# Patient Record
Sex: Male | Born: 1950 | Race: Asian | Hispanic: No | Marital: Married | Smoking: Former smoker
Health system: Southern US, Community
[De-identification: ages and names within clinical notes are randomized; demographics above are authoritative.]

## PROBLEM LIST (undated history)

## (undated) DIAGNOSIS — J189 Pneumonia, unspecified organism: Secondary | ICD-10-CM

## (undated) DIAGNOSIS — I4891 Unspecified atrial fibrillation: Secondary | ICD-10-CM

## (undated) DIAGNOSIS — I509 Heart failure, unspecified: Secondary | ICD-10-CM

## (undated) DIAGNOSIS — R011 Cardiac murmur, unspecified: Secondary | ICD-10-CM

## (undated) DIAGNOSIS — J449 Chronic obstructive pulmonary disease, unspecified: Secondary | ICD-10-CM

## (undated) HISTORY — PX: CHOLECYSTECTOMY: SHX55

## (undated) HISTORY — PX: CORONARY ARTERY BYPASS GRAFT: SHX141

## (undated) HISTORY — PX: OTHER SURGICAL HISTORY: SHX169

---

## 2013-07-27 ENCOUNTER — Emergency Department (HOSPITAL_BASED_OUTPATIENT_CLINIC_OR_DEPARTMENT_OTHER): Payer: Medicare Other

## 2013-07-27 ENCOUNTER — Emergency Department (HOSPITAL_BASED_OUTPATIENT_CLINIC_OR_DEPARTMENT_OTHER)
Admission: EM | Admit: 2013-07-27 | Discharge: 2013-07-27 | Disposition: A | Discharge status: Home / Self Care | Payer: Medicare Other | Attending: Emergency Medicine | Admitting: Emergency Medicine

## 2013-07-27 ENCOUNTER — Encounter (HOSPITAL_BASED_OUTPATIENT_CLINIC_OR_DEPARTMENT_OTHER): Payer: Self-pay | Admitting: Emergency Medicine

## 2013-07-27 DIAGNOSIS — Z8639 Personal history of other endocrine, nutritional and metabolic disease: Secondary | ICD-10-CM | POA: Insufficient documentation

## 2013-07-27 DIAGNOSIS — I509 Heart failure, unspecified: Secondary | ICD-10-CM | POA: Insufficient documentation

## 2013-07-27 DIAGNOSIS — Z87891 Personal history of nicotine dependence: Secondary | ICD-10-CM | POA: Insufficient documentation

## 2013-07-27 DIAGNOSIS — I4891 Unspecified atrial fibrillation: Secondary | ICD-10-CM | POA: Insufficient documentation

## 2013-07-27 DIAGNOSIS — Z79899 Other long term (current) drug therapy: Secondary | ICD-10-CM | POA: Insufficient documentation

## 2013-07-27 DIAGNOSIS — J40 Bronchitis, not specified as acute or chronic: Secondary | ICD-10-CM | POA: Insufficient documentation

## 2013-07-27 DIAGNOSIS — R5381 Other malaise: Secondary | ICD-10-CM | POA: Insufficient documentation

## 2013-07-27 DIAGNOSIS — Z8701 Personal history of pneumonia (recurrent): Secondary | ICD-10-CM | POA: Insufficient documentation

## 2013-07-27 DIAGNOSIS — Z7901 Long term (current) use of anticoagulants: Secondary | ICD-10-CM | POA: Insufficient documentation

## 2013-07-27 DIAGNOSIS — Z951 Presence of aortocoronary bypass graft: Secondary | ICD-10-CM | POA: Insufficient documentation

## 2013-07-27 DIAGNOSIS — R011 Cardiac murmur, unspecified: Secondary | ICD-10-CM | POA: Insufficient documentation

## 2013-07-27 DIAGNOSIS — R5383 Other fatigue: Secondary | ICD-10-CM

## 2013-07-27 DIAGNOSIS — Z862 Personal history of diseases of the blood and blood-forming organs and certain disorders involving the immune mechanism: Secondary | ICD-10-CM | POA: Insufficient documentation

## 2013-07-27 HISTORY — DX: Hemochromatosis, unspecified: E83.119

## 2013-07-27 HISTORY — DX: Unspecified atrial fibrillation: I48.91

## 2013-07-27 HISTORY — DX: Pneumonia, unspecified organism: J18.9

## 2013-07-27 HISTORY — DX: Heart failure, unspecified: I50.9

## 2013-07-27 HISTORY — DX: Cardiac murmur, unspecified: R01.1

## 2013-07-27 LAB — CBC WITH DIFFERENTIAL/PLATELET
BASOS ABS: 0 10*3/uL (ref 0.0–0.1)
Basophils Relative: 0 % (ref 0–1)
EOS PCT: 27 % — AB (ref 0–5)
Eosinophils Absolute: 2.1 10*3/uL — ABNORMAL HIGH (ref 0.0–0.7)
HCT: 28.4 % — ABNORMAL LOW (ref 39.0–52.0)
Hemoglobin: 9 g/dL — ABNORMAL LOW (ref 13.0–17.0)
Lymphocytes Relative: 28 % (ref 12–46)
Lymphs Abs: 2.2 10*3/uL (ref 0.7–4.0)
MCH: 24.5 pg — ABNORMAL LOW (ref 26.0–34.0)
MCHC: 31.7 g/dL (ref 30.0–36.0)
MCV: 77.2 fL — AB (ref 78.0–100.0)
MONOS PCT: 8 % (ref 3–12)
Monocytes Absolute: 0.6 10*3/uL (ref 0.1–1.0)
NEUTROS PCT: 37 % — AB (ref 43–77)
Neutro Abs: 2.8 10*3/uL (ref 1.7–7.7)
PLATELETS: 477 10*3/uL — AB (ref 150–400)
RBC: 3.68 MIL/uL — AB (ref 4.22–5.81)
RDW: 27.1 % — ABNORMAL HIGH (ref 11.5–15.5)
WBC: 7.7 10*3/uL (ref 4.0–10.5)
nRBC: 174 /100 WBC — ABNORMAL HIGH

## 2013-07-27 LAB — BASIC METABOLIC PANEL
BUN: 15 mg/dL (ref 6–23)
CO2: 24 meq/L (ref 19–32)
Calcium: 9.2 mg/dL (ref 8.4–10.5)
Chloride: 100 mEq/L (ref 96–112)
Creatinine, Ser: 1.6 mg/dL — ABNORMAL HIGH (ref 0.50–1.35)
GFR calc Af Amer: 51 mL/min — ABNORMAL LOW (ref >90)
GFR, EST NON AFRICAN AMERICAN: 44 mL/min — AB (ref >90)
Glucose, Bld: 155 mg/dL — ABNORMAL HIGH (ref 70–99)
Potassium: 4 mEq/L (ref 3.7–5.3)
SODIUM: 138 meq/L (ref 137–147)

## 2013-07-27 LAB — D-DIMER, QUANTITATIVE (NOT AT ARMC): D DIMER QUANT: 0.49 ug{FEU}/mL — AB (ref 0.00–0.48)

## 2013-07-27 MED ORDER — HYDROCODONE-HOMATROPINE 5-1.5 MG/5ML PO SYRP: 5.0000 mL | ORAL_SOLUTION | Freq: Four times a day (QID) | ORAL | Status: AC | PRN

## 2013-07-27 MED ORDER — PREDNISONE 20 MG PO TABS: ORAL_TABLET | ORAL | Status: DC

## 2013-07-27 MED ORDER — PREDNISONE 50 MG PO TABS
60.0000 mg | ORAL_TABLET | Freq: Once | ORAL | Status: AC
  Administered 2013-07-27: 60 mg via ORAL
  Filled 2013-07-27 (×2): qty 1

## 2013-07-27 MED ORDER — IPRATROPIUM-ALBUTEROL 0.5-2.5 (3) MG/3ML IN SOLN
3.0000 mL | Freq: Once | RESPIRATORY_TRACT | Status: AC
  Administered 2013-07-27: 3 mL via RESPIRATORY_TRACT
  Filled 2013-07-27: qty 3

## 2013-07-27 NOTE — Discharge Instructions (Signed)

## 2013-07-27 NOTE — ED Notes (Signed)
Recent treatment for pneumonia with PO antibiotics.  Here visiting and reports increased cough and left rib pain.

## 2013-07-27 NOTE — ED Provider Notes (Addendum)
CSN: [id number]     Arrival date & time 07/27/13  1543 History   First MD Initiated Contact with Patient 07/27/13 1600     Chief Complaint  Patient presents with  . Chest Pain     (Consider location/radiation/quality/duration/timing/severity/associated sxs/prior Treatment) HPI Comments: Patient presents with cough and shortness of breath. He's visiting from [place]. He was diagnosed in [place] with pneumonia about a month ago. He finished 2 rounds of antibiotics. He's had worsening cough and shortness of breath with associated wheezing over the last few days. [facility] He was given a prescription for cough syrup but he wasn't able to get it filled because the pharmacy didn't have it. He's had some worsening pain in his left ribs. He sees is worse with coughing. He denies any fevers or chills. He denies any leg pain or swelling. He states he's feeling achy over. He has worsening shortness of breath when he lays down.  Patient is a 63 y.o. male presenting with chest pain.  Chest Pain Associated symptoms: cough, fatigue and shortness of breath   Associated symptoms: no abdominal pain, no back pain, no diaphoresis, no dizziness, no fever, no headache, no nausea, no numbness, not vomiting and no weakness     Past Medical History  Diagnosis Date  . Atrial fibrillation   . Pneumonia   . CHF (congestive heart failure)   . Thalassemia minor   . Murmur   . Hemochromatosis    Past Surgical History  Procedure Laterality Date  . Coronary artery bypass graft    . Cholecystectomy    . Spleenectomy     No family history on file. History  Substance Use Topics  . Smoking status: Former Games developer  . Smokeless tobacco: Not on file  . Alcohol Use: No    Review of Systems  Constitutional: Positive for fatigue. Negative for fever, chills and diaphoresis.  HENT: Negative for congestion, rhinorrhea and sneezing.   Eyes: Negative.   Respiratory: Positive for cough and  shortness of breath. Negative for chest tightness.   Cardiovascular: Positive for chest pain (Left rib pain). Negative for leg swelling.  Gastrointestinal: Negative for nausea, vomiting, abdominal pain, diarrhea and blood in stool.  Genitourinary: Negative for frequency, hematuria, flank pain and difficulty urinating.  Musculoskeletal: Negative for arthralgias and back pain.  Skin: Negative for rash.  Neurological: Negative for dizziness, speech difficulty, weakness, numbness and headaches.      Allergies  Review of patient's allergies indicates no known allergies.  Home Medications   Current Outpatient Rx  Name  Route  Sig  Dispense  Refill  . citalopram (CELEXA) 20 MG tablet   Oral   Take 20 mg by mouth daily.         Marland Kitchen deferasirox (EXJADE) 250 MG disintegrating tablet   Oral   Take by mouth daily before breakfast.         . folic acid (FOLVITE) 1 MG tablet   Oral   Take 1 mg by mouth daily.         Marland Kitchen lisinopril (PRINIVIL,ZESTRIL) 10 MG tablet   Oral   Take 10 mg by mouth daily.         . rivaroxaban (XARELTO) 10 MG TABS tablet   Oral   Take 20 mg by mouth daily.         Marland Kitchen HYDROcodone-homatropine (HYCODAN) 5-1.5 MG/5ML syrup   Oral   Take 5 mLs by mouth every 6 (  six) hours as needed for cough.   120 mL   0   . predniSONE (DELTASONE) 20 MG tablet      2 tabs po daily x 4 days   8 tablet   0    BP 109/60  Pulse 86  Temp(Src) 98.7 F (37.1 C) (Oral)  Resp 20  Ht 5\' 6"  (1.676 m)  Wt 140 lb (63.504 kg)  BMI 22.61 kg/m2  SpO2 95% Physical Exam  Constitutional: He is oriented to person, place, and time. He appears well-developed and well-nourished.  HENT:  Head: Normocephalic and atraumatic.  Mouth/Throat: Oropharynx is clear and moist.  Eyes: Pupils are equal, round, and reactive to light.  Neck: Normal range of motion. Neck supple.  Cardiovascular: Normal rate, regular rhythm and normal heart sounds.   Pulmonary/Chest: Effort normal. No  respiratory distress. He has wheezes. He has no rales. He exhibits no tenderness.  Patient has diffuse expiratory wheezing and rhonchi bilaterally. There is no increased work of breathing.  Abdominal: Soft. Bowel sounds are normal. There is no tenderness. There is no rebound and no guarding.  Musculoskeletal: Normal range of motion. He exhibits no edema.  No calf tenderness  Lymphadenopathy:    He has no cervical adenopathy.  Neurological: He is alert and oriented to person, place, and time.  Skin: Skin is warm and dry. No rash noted.  Psychiatric: He has a normal mood and affect.    ED Course  Procedures (including critical care time) Labs Review Labs Reviewed  D-DIMER, QUANTITATIVE - Abnormal; Notable for the following:    D-Dimer, Quant 0.49 (*)    All other components within normal limits  BASIC METABOLIC PANEL - Abnormal; Notable for the following:    Glucose, Bld 155 (*)    Creatinine, Ser 1.60 (*)    GFR calc non Af Amer 44 (*)    GFR calc Af Amer 51 (*)    All other components within normal limits  CBC WITH DIFFERENTIAL - Abnormal; Notable for the following:    RBC 3.68 (*)    Hemoglobin 9.0 (*)    HCT 28.4 (*)    MCV 77.2 (*)    MCH 24.5 (*)    RDW 27.1 (*)    Platelets 477 (*)    Neutrophils Relative % 37 (*)    Eosinophils Relative 27 (*)    nRBC 174 (*)    Eosinophils Absolute 2.1 (*)    All other components within normal limits  PATHOLOGIST SMEAR REVIEW   Imaging Review Dg Chest 2 View  07/27/2013   CLINICAL DATA:  Cough and congestion  EXAM: CHEST - 2 VIEW  COMPARISON:  None.  FINDINGS: Borderline cardiomegaly with prominence of the left ventricle. Postoperative changes from transverse sternotomy. There is tenting of the left hemidiaphragm compatible with scarring. Lungs are otherwise clear. No pneumothorax or pleural effusion. Mild hyperaeration.  IMPRESSION: No active cardiopulmonary disease. Chronic and postoperative changes are noted   Electronically Signed    By: Maryclare BeanArt  Hoss M.D.   On: 07/27/2013 16:21     EKG Interpretation None      MDM   Final diagnoses:  Bronchitis    Patient presents with cough and wheezing and left-sided rib pain. There's no evidence of pneumonia or pneumothorax on chest x-ray. His symptoms improved markedly after a nebulizer treatment. He was also given a dose of prednisone. I did order a d-dimer that was just barely outside the normal range. I did not realize that he  is on Xarelto. He takes because he had a clot in a vessel in his head when his iron levels were really high due to his hemochromatosis.  He denies hx of DVT/PE.  He did not have any actual pleuritic chest pain. It's worse with coughing but is not worse with deep breathing. He doesn't have any tachycardia or hypoxia. He has no unilateral leg swelling or calf tenderness. Given this and his elevation in creatinine I did not feel that he needed a CTA at this point. I feel that his symptoms are more likely due to bronchitis. He was given dose of prednisone in ED on his discharge with the four-day course of prednisone. He was also given a prescription for Hycodan cough syrup. I did advise him that his creatinine and his blood glucose were elevated and he needs to have this followed up by his doctor in [place]. I advised him return here for symptoms worsen.    Rolan Bucco, MD 07/27/13 1610  Rolan Bucco, MD 07/27/13 9604

## 2013-07-28 LAB — PATHOLOGIST SMEAR REVIEW

## 2014-07-26 ENCOUNTER — Encounter (HOSPITAL_BASED_OUTPATIENT_CLINIC_OR_DEPARTMENT_OTHER): Payer: Self-pay | Admitting: *Deleted

## 2014-07-26 ENCOUNTER — Inpatient Hospital Stay (HOSPITAL_BASED_OUTPATIENT_CLINIC_OR_DEPARTMENT_OTHER)
Admission: EM | Admit: 2014-07-26 | Discharge: 2014-07-29 | DRG: 191 | Disposition: A | Discharge status: Home / Self Care | Payer: Medicare Other | Attending: Internal Medicine | Admitting: Internal Medicine

## 2014-07-26 ENCOUNTER — Emergency Department (HOSPITAL_BASED_OUTPATIENT_CLINIC_OR_DEPARTMENT_OTHER): Payer: Medicare Other

## 2014-07-26 DIAGNOSIS — I482 Chronic atrial fibrillation, unspecified: Secondary | ICD-10-CM | POA: Diagnosis present

## 2014-07-26 DIAGNOSIS — F39 Unspecified mood [affective] disorder: Secondary | ICD-10-CM | POA: Diagnosis present

## 2014-07-26 DIAGNOSIS — J441 Chronic obstructive pulmonary disease with (acute) exacerbation: Principal | ICD-10-CM | POA: Diagnosis present

## 2014-07-26 DIAGNOSIS — R0602 Shortness of breath: Secondary | ICD-10-CM | POA: Diagnosis not present

## 2014-07-26 DIAGNOSIS — Z888 Allergy status to other drugs, medicaments and biological substances status: Secondary | ICD-10-CM

## 2014-07-26 DIAGNOSIS — Z9049 Acquired absence of other specified parts of digestive tract: Secondary | ICD-10-CM | POA: Diagnosis present

## 2014-07-26 DIAGNOSIS — Z87891 Personal history of nicotine dependence: Secondary | ICD-10-CM

## 2014-07-26 DIAGNOSIS — D649 Anemia, unspecified: Secondary | ICD-10-CM | POA: Diagnosis present

## 2014-07-26 DIAGNOSIS — T380X5A Adverse effect of glucocorticoids and synthetic analogues, initial encounter: Secondary | ICD-10-CM | POA: Diagnosis present

## 2014-07-26 DIAGNOSIS — I5032 Chronic diastolic (congestive) heart failure: Secondary | ICD-10-CM | POA: Diagnosis present

## 2014-07-26 DIAGNOSIS — D563 Thalassemia minor: Secondary | ICD-10-CM | POA: Diagnosis not present

## 2014-07-26 DIAGNOSIS — I10 Essential (primary) hypertension: Secondary | ICD-10-CM | POA: Diagnosis present

## 2014-07-26 DIAGNOSIS — R0781 Pleurodynia: Secondary | ICD-10-CM | POA: Diagnosis present

## 2014-07-26 DIAGNOSIS — I509 Heart failure, unspecified: Secondary | ICD-10-CM | POA: Diagnosis not present

## 2014-07-26 DIAGNOSIS — Z7901 Long term (current) use of anticoagulants: Secondary | ICD-10-CM

## 2014-07-26 DIAGNOSIS — R739 Hyperglycemia, unspecified: Secondary | ICD-10-CM | POA: Diagnosis present

## 2014-07-26 DIAGNOSIS — Z951 Presence of aortocoronary bypass graft: Secondary | ICD-10-CM

## 2014-07-26 HISTORY — DX: Chronic obstructive pulmonary disease, unspecified: J44.9

## 2014-07-26 LAB — CBC WITH DIFFERENTIAL/PLATELET
BASOS ABS: 0 10*3/uL (ref 0.0–0.1)
BASOS PCT: 0 % (ref 0–1)
EOS ABS: 0.3 10*3/uL (ref 0.0–0.7)
Eosinophils Relative: 7 % — ABNORMAL HIGH (ref 0–5)
HEMATOCRIT: 26.7 % — AB (ref 39.0–52.0)
Hemoglobin: 8.4 g/dL — ABNORMAL LOW (ref 13.0–17.0)
LYMPHS ABS: 1.7 10*3/uL (ref 0.7–4.0)
Lymphocytes Relative: 36 % (ref 12–46)
MCH: 24.5 pg — AB (ref 26.0–34.0)
MCHC: 31.5 g/dL (ref 30.0–36.0)
MCV: 77.8 fL — ABNORMAL LOW (ref 78.0–100.0)
MONO ABS: 0.1 10*3/uL (ref 0.1–1.0)
Monocytes Relative: 2 % — ABNORMAL LOW (ref 3–12)
NEUTROS ABS: 2.5 10*3/uL (ref 1.7–7.7)
NEUTROS PCT: 55 % (ref 43–77)
PLATELETS: 422 10*3/uL — AB (ref 150–400)
RBC: 3.43 MIL/uL — ABNORMAL LOW (ref 4.22–5.81)
RDW: 27.6 % — ABNORMAL HIGH (ref 11.5–15.5)
WBC: 4.6 10*3/uL (ref 4.0–10.5)
nRBC: 330 /100 WBC — ABNORMAL HIGH

## 2014-07-26 LAB — BRAIN NATRIURETIC PEPTIDE: B Natriuretic Peptide: 272.6 pg/mL — ABNORMAL HIGH (ref 0.0–100.0)

## 2014-07-26 LAB — COMPREHENSIVE METABOLIC PANEL
ALT: 15 U/L (ref 0–53)
AST: 31 U/L (ref 0–37)
Albumin: 3.8 g/dL (ref 3.5–5.2)
Alkaline Phosphatase: 71 U/L (ref 39–117)
Anion gap: 8 (ref 5–15)
BUN: 12 mg/dL (ref 6–23)
CALCIUM: 8.1 mg/dL — AB (ref 8.4–10.5)
CO2: 24 mmol/L (ref 19–32)
CREATININE: 0.93 mg/dL (ref 0.50–1.35)
Chloride: 99 mmol/L (ref 96–112)
GFR, EST NON AFRICAN AMERICAN: 86 mL/min — AB (ref >90)
GLUCOSE: 258 mg/dL — AB (ref 70–99)
Potassium: 4.3 mmol/L (ref 3.5–5.1)
SODIUM: 131 mmol/L — AB (ref 135–145)
Total Bilirubin: 3.7 mg/dL — ABNORMAL HIGH (ref 0.3–1.2)
Total Protein: 7 g/dL (ref 6.0–8.3)

## 2014-07-26 LAB — TROPONIN I

## 2014-07-26 MED ORDER — IPRATROPIUM-ALBUTEROL 0.5-2.5 (3) MG/3ML IN SOLN
3.0000 mL | Freq: Once | RESPIRATORY_TRACT | Status: AC
  Administered 2014-07-26: 3 mL via RESPIRATORY_TRACT
  Filled 2014-07-26: qty 3

## 2014-07-26 MED ORDER — METHYLPREDNISOLONE SODIUM SUCC 125 MG IJ SOLR
125.0000 mg | Freq: Once | INTRAMUSCULAR | Status: AC
  Administered 2014-07-26: 125 mg via INTRAVENOUS
  Filled 2014-07-26: qty 2

## 2014-07-26 MED ORDER — ALBUTEROL (5 MG/ML) CONTINUOUS INHALATION SOLN
10.0000 mg/h | INHALATION_SOLUTION | RESPIRATORY_TRACT | Status: AC
  Administered 2014-07-26: 10 mg/h via RESPIRATORY_TRACT
  Filled 2014-07-26: qty 20

## 2014-07-26 MED ORDER — ALBUTEROL SULFATE (2.5 MG/3ML) 0.083% IN NEBU
2.5000 mg | INHALATION_SOLUTION | Freq: Once | RESPIRATORY_TRACT | Status: AC
  Administered 2014-07-26: 2.5 mg via RESPIRATORY_TRACT
  Filled 2014-07-26: qty 3

## 2014-07-26 NOTE — ED Notes (Signed)
Pt with increased SOB x 2 days- hx of COPD- audible wheezing in triage- RT evaluating

## 2014-07-26 NOTE — ED Notes (Signed)
Respirations even and unlabored while being registered, does not appear in any acute distress.

## 2014-07-26 NOTE — ED Provider Notes (Signed)
CSN: [id number]     Arrival date & time 07/26/14  1925 History  This chart was scribed for Geoffery Lyons, MD by Gwenyth Ober, ED Scribe. This patient was seen in room MH05/MH05 and the patient's care was started at 8:27 PM.    Chief Complaint  Patient presents with  . Shortness of Breath   Patient is a 64 y.o. male presenting with shortness of breath. The history is provided by a relative. No language interpreter was used.  Shortness of Breath Severity:  Moderate Onset quality:  Gradual Duration:  3 days Timing:  Constant Progression:  Worsening Chronicity:  Chronic Relieved by:  Nothing Worsened by:  Nothing tried Ineffective treatments:  None tried Associated symptoms: cough and wheezing   Associated symptoms: no fever     HPI Comments: Barry Reed is a 64 y.o. male with a history of A-fib, CHF, CABG and COPD who presents to the Emergency Department complaining of an acute-on-chronic episode of wheezing and SOB that started yesterday. His daughter states productive cough with black-brown phlegm as associated symptoms. Pt was recently admitted to the hospital for 4 days in [place] where he lives, for pneumonia and was prescribed Prednisone. Pt's daughter denies a history of cardiac catheterization. She denies tobacco use. She also denies fever as an associated symptom.  Past Medical History  Diagnosis Date  . Atrial fibrillation   . Pneumonia   . CHF (congestive heart failure)   . Thalassemia minor   . Murmur   . Hemochromatosis   . COPD (chronic obstructive pulmonary disease)    Past Surgical History  Procedure Laterality Date  . Coronary artery bypass graft    . Cholecystectomy    . Spleenectomy     No family history on file. History  Substance Use Topics  . Smoking status: Former Games developer  . Smokeless tobacco: Never Used  . Alcohol Use: No    Review of Systems  Constitutional: Negative for fever.  Respiratory: Positive for cough, shortness of breath and  wheezing.   All other systems reviewed and are negative.  Allergies  Metoprolol  Home Medications   Prior to Admission medications   Medication Sig Start Date End Date Taking? Authorizing Provider  albuterol (PROVENTIL HFA;VENTOLIN HFA) 108 (90 BASE) MCG/ACT inhaler Inhale into the lungs every 6 (six) hours as needed for wheezing or shortness of breath.   Yes Historical Provider, MD  albuterol-ipratropium (COMBIVENT) 18-103 MCG/ACT inhaler Inhale into the lungs every 4 (four) hours.   Yes Historical Provider, MD  citalopram (CELEXA) 20 MG tablet Take 20 mg by mouth daily.   Yes Historical Provider, MD  folic acid (FOLVITE) 1 MG tablet Take 1 mg by mouth daily.   Yes Historical Provider, MD  furosemide (LASIX) 20 MG tablet Take 20 mg by mouth.   Yes Historical Provider, MD  HYDROcodone-acetaminophen (NORCO) 10-325 MG per tablet Take 1 tablet by mouth every 6 (six) hours as needed.   Yes Historical Provider, MD  loratadine (CLARITIN) 10 MG tablet Take 10 mg by mouth daily.   Yes Historical Provider, MD  rivaroxaban (XARELTO) 10 MG TABS tablet Take 20 mg by mouth daily.   Yes Historical Provider, MD  zolmitriptan (ZOMIG) 5 MG tablet Take 5 mg by mouth as needed for migraine.   Yes Historical Provider, MD  deferasirox (EXJADE) 250 MG disintegrating tablet Take by mouth daily before breakfast.    Historical Provider, MD  HYDROcodone-homatropine (HYCODAN) 5-1.5 MG/5ML syrup Take 5 mLs by mouth every 6 (six)  hours as needed for cough. 07/27/13   Rolan BuccoMelanie Belfi, MD  lisinopril (PRINIVIL,ZESTRIL) 10 MG tablet Take 10 mg by mouth daily.    Historical Provider, MD  predniSONE (DELTASONE) 20 MG tablet 2 tabs po daily x 4 days 07/27/13   Rolan BuccoMelanie Belfi, MD   BP 111/63 mmHg  Pulse 92  Temp(Src) 98.2 F (36.8 C) (Oral)  Resp 24  Ht 5\' 6"  (1.676 m)  Wt 134 lb (60.782 kg)  BMI 21.64 kg/m2  SpO2 97% Physical Exam  Constitutional: He appears well-developed and well-nourished. No distress.  HENT:  Head:  Normocephalic and atraumatic.  Eyes: Conjunctivae and EOM are normal.  Neck: Neck supple. No tracheal deviation present.  Cardiovascular: Normal rate, regular rhythm, normal heart sounds and intact distal pulses.   Pulmonary/Chest: He is in respiratory distress. He has wheezes.  Bilateral expiratory wheezes present. Mild respiratory distress noted.  Musculoskeletal: Normal range of motion. He exhibits no edema.  Skin: Skin is warm and dry.  Psychiatric: He has a normal mood and affect. His behavior is normal.  Nursing note and vitals reviewed.   ED Course  Procedures   DIAGNOSTIC STUDIES: Oxygen Saturation is 97% on RA, normal by my interpretation.    COORDINATION OF CARE: 8:37 PM Discussed treatment plan with pt which includes chest x-ray and breathing treatment. Pt agreed to plan.   Labs Review Labs Reviewed - No data to display  Imaging Review No results found.   EKG Interpretation   Date/Time:  Thursday July 26 2014 20:56:56 EDT Ventricular Rate:  115 PR Interval:    QRS Duration: 90 QT Interval:  338 QTC Calculation: 467 R Axis:   122 Text Interpretation:  Atrial fibrillation with rapid ventricular response  Left posterior fascicular block Possible Inferior infarct , age  undetermined Abnormal ECG Confirmed by DELOS  MD, Zerline Melchior ([id number]) on  07/26/2014 9:18:23 PM      MDM   Final diagnoses:  None    Patient is a 64 year old male with history of COPD, CHF, atrial fibrillation. He presents for evaluation of shortness of breath that is worsened over the past 3 days. [place] was recently [place] exacerbation/p[place]onia. Today's workup is consistent with an exacerbation of COPD. There is no evidence for a cardiac etiology. He was given albuterol treatments, steroids, but continues to wheeze and displaying mild respiratory distress. I've discussed the case with the hospitalist who agrees to admit. [facility].  I personally performed the services described in this documentation, which was scribed in my presence. The recorded information has been reviewed and is accurate.      Geoffery Lyonsouglas Zaylin Pistilli, MD 07/28/14 2312

## 2014-07-27 DIAGNOSIS — J441 Chronic obstructive pulmonary disease with (acute) exacerbation: Principal | ICD-10-CM

## 2014-07-27 DIAGNOSIS — Z7901 Long term (current) use of anticoagulants: Secondary | ICD-10-CM

## 2014-07-27 DIAGNOSIS — D649 Anemia, unspecified: Secondary | ICD-10-CM | POA: Diagnosis present

## 2014-07-27 DIAGNOSIS — I482 Chronic atrial fibrillation, unspecified: Secondary | ICD-10-CM | POA: Diagnosis present

## 2014-07-27 DIAGNOSIS — R739 Hyperglycemia, unspecified: Secondary | ICD-10-CM | POA: Diagnosis present

## 2014-07-27 DIAGNOSIS — R0781 Pleurodynia: Secondary | ICD-10-CM | POA: Diagnosis present

## 2014-07-27 LAB — CBC WITH DIFFERENTIAL/PLATELET
BAND NEUTROPHILS: 0 % (ref 0–10)
Basophils Absolute: 0 10*3/uL (ref 0.0–0.1)
Basophils Relative: 0 % (ref 0–1)
Blasts: 0 %
EOS ABS: 0.3 10*3/uL (ref 0.0–0.7)
EOS PCT: 2 % (ref 0–5)
HEMATOCRIT: 26.1 % — AB (ref 39.0–52.0)
HEMOGLOBIN: 7.9 g/dL — AB (ref 13.0–17.0)
LYMPHS ABS: 0.4 10*3/uL — AB (ref 0.7–4.0)
Lymphocytes Relative: 3 % — ABNORMAL LOW (ref 12–46)
MCH: 23.9 pg — ABNORMAL LOW (ref 26.0–34.0)
MCHC: 30.3 g/dL (ref 30.0–36.0)
MCV: 79.1 fL (ref 78.0–100.0)
MONO ABS: 0.1 10*3/uL (ref 0.1–1.0)
MONOS PCT: 1 % — AB (ref 3–12)
Metamyelocytes Relative: 0 %
Myelocytes: 0 %
NRBC: 0 /100{WBCs}
Neutro Abs: 12.8 10*3/uL — ABNORMAL HIGH (ref 1.7–7.7)
Neutrophils Relative %: 94 % — ABNORMAL HIGH (ref 43–77)
PLATELETS: 394 10*3/uL (ref 150–400)
Promyelocytes Absolute: 0 %
RBC: 3.3 MIL/uL — AB (ref 4.22–5.81)
RDW: 27.1 % — ABNORMAL HIGH (ref 11.5–15.5)
WBC: 13.6 10*3/uL — ABNORMAL HIGH (ref 4.0–10.5)

## 2014-07-27 LAB — COMPREHENSIVE METABOLIC PANEL
ALT: 14 U/L (ref 0–53)
AST: 31 U/L (ref 0–37)
Albumin: 3.3 g/dL — ABNORMAL LOW (ref 3.5–5.2)
Alkaline Phosphatase: 71 U/L (ref 39–117)
Anion gap: 11 (ref 5–15)
BILIRUBIN TOTAL: 4.1 mg/dL — AB (ref 0.3–1.2)
BUN: 14 mg/dL (ref 6–23)
CALCIUM: 8.2 mg/dL — AB (ref 8.4–10.5)
CO2: 22 mmol/L (ref 19–32)
CREATININE: 1.52 mg/dL — AB (ref 0.50–1.35)
Chloride: 98 mmol/L (ref 96–112)
GFR calc Af Amer: 54 mL/min — ABNORMAL LOW (ref >90)
GFR calc non Af Amer: 46 mL/min — ABNORMAL LOW (ref >90)
Glucose, Bld: 459 mg/dL — ABNORMAL HIGH (ref 70–99)
Potassium: 4.6 mmol/L (ref 3.5–5.1)
SODIUM: 131 mmol/L — AB (ref 135–145)
TOTAL PROTEIN: 6.5 g/dL (ref 6.0–8.3)

## 2014-07-27 LAB — GLUCOSE, CAPILLARY: Glucose-Capillary: 397 mg/dL — ABNORMAL HIGH (ref 70–99)

## 2014-07-27 LAB — MAGNESIUM: MAGNESIUM: 1.8 mg/dL (ref 1.5–2.5)

## 2014-07-27 LAB — TROPONIN I
Troponin I: 0.04 ng/mL — ABNORMAL HIGH (ref <0.031)
Troponin I: 0.06 ng/mL — ABNORMAL HIGH (ref <0.031)
Troponin I: 0.05 ng/mL — ABNORMAL HIGH (ref <0.031)

## 2014-07-27 LAB — PROTIME-INR
INR: 3.05 — AB (ref 0.00–1.49)
PROTHROMBIN TIME: 31.8 s — AB (ref 11.6–15.2)

## 2014-07-27 MED ORDER — INSULIN GLARGINE 100 UNIT/ML ~~LOC~~ SOLN
10.0000 [IU] | Freq: Every day | SUBCUTANEOUS | Status: DC
  Administered 2014-07-27: 10 [IU] via SUBCUTANEOUS
  Filled 2014-07-27 (×3): qty 0.1

## 2014-07-27 MED ORDER — LEVOFLOXACIN IN D5W 750 MG/150ML IV SOLN
750.0000 mg | INTRAVENOUS | Status: DC
  Administered 2014-07-27 – 2014-07-28 (×2): 750 mg via INTRAVENOUS
  Filled 2014-07-27 (×2): qty 150

## 2014-07-27 MED ORDER — LEVALBUTEROL HCL 0.63 MG/3ML IN NEBU
0.6300 mg | INHALATION_SOLUTION | Freq: Four times a day (QID) | RESPIRATORY_TRACT | Status: DC
  Administered 2014-07-27 – 2014-07-29 (×9): 0.63 mg via RESPIRATORY_TRACT
  Filled 2014-07-27 (×21): qty 3

## 2014-07-27 MED ORDER — FUROSEMIDE 40 MG PO TABS
40.0000 mg | ORAL_TABLET | Freq: Every day | ORAL | Status: DC
  Administered 2014-07-27 – 2014-07-29 (×3): 40 mg via ORAL
  Filled 2014-07-27 (×3): qty 1

## 2014-07-27 MED ORDER — INSULIN ASPART 100 UNIT/ML ~~LOC~~ SOLN
0.0000 [IU] | Freq: Three times a day (TID) | SUBCUTANEOUS | Status: DC
  Administered 2014-07-28: 15 [IU] via SUBCUTANEOUS
  Administered 2014-07-28 – 2014-07-29 (×3): 3 [IU] via SUBCUTANEOUS
  Administered 2014-07-29: 11 [IU] via SUBCUTANEOUS

## 2014-07-27 MED ORDER — PREDNISONE 50 MG PO TABS
50.0000 mg | ORAL_TABLET | Freq: Every day | ORAL | Status: DC
  Administered 2014-07-27 – 2014-07-29 (×3): 50 mg via ORAL
  Filled 2014-07-27 (×4): qty 1

## 2014-07-27 MED ORDER — HYDROCODONE-ACETAMINOPHEN 10-325 MG PO TABS
1.0000 | ORAL_TABLET | Freq: Four times a day (QID) | ORAL | Status: DC | PRN
  Administered 2014-07-27: 1 via ORAL
  Filled 2014-07-27: qty 1

## 2014-07-27 MED ORDER — FOLIC ACID 1 MG PO TABS
1.0000 mg | ORAL_TABLET | Freq: Every day | ORAL | Status: DC
  Administered 2014-07-27 – 2014-07-29 (×3): 1 mg via ORAL
  Filled 2014-07-27 (×3): qty 1

## 2014-07-27 MED ORDER — RIVAROXABAN 20 MG PO TABS
20.0000 mg | ORAL_TABLET | Freq: Every day | ORAL | Status: DC
  Filled 2014-07-27 (×2): qty 1

## 2014-07-27 MED ORDER — IPRATROPIUM BROMIDE 0.02 % IN SOLN
0.5000 mg | Freq: Four times a day (QID) | RESPIRATORY_TRACT | Status: DC
Start: 2014-07-27 — End: 2014-07-29
  Administered 2014-07-27 – 2014-07-29 (×9): 0.5 mg via RESPIRATORY_TRACT
  Filled 2014-07-27 (×9): qty 2.5

## 2014-07-27 MED ORDER — CITALOPRAM HYDROBROMIDE 20 MG PO TABS
20.0000 mg | ORAL_TABLET | Freq: Every day | ORAL | Status: DC
  Administered 2014-07-27 – 2014-07-29 (×3): 20 mg via ORAL
  Filled 2014-07-27 (×3): qty 1

## 2014-07-27 MED ORDER — OXYCODONE HCL 5 MG PO TABS
5.0000 mg | ORAL_TABLET | Freq: Once | ORAL | Status: AC
  Administered 2014-07-27: 5 mg via ORAL
  Filled 2014-07-27: qty 1

## 2014-07-27 MED ORDER — LORATADINE 10 MG PO TABS
10.0000 mg | ORAL_TABLET | Freq: Every day | ORAL | Status: DC
  Administered 2014-07-27 – 2014-07-29 (×3): 10 mg via ORAL
  Filled 2014-07-27 (×3): qty 1

## 2014-07-27 MED ORDER — ZOLPIDEM TARTRATE 5 MG PO TABS
5.0000 mg | ORAL_TABLET | Freq: Every evening | ORAL | Status: DC | PRN
  Administered 2014-07-27: 5 mg via ORAL
  Filled 2014-07-27: qty 1

## 2014-07-27 NOTE — Progress Notes (Signed)
ANTICOAGULATION CONSULT NOTE - Initial Consult  Pharmacy Consult for Xarelto Indication: atrial fibrillation  Allergies  Allergen Reactions  . Metoprolol Other (See Comments)    Unknown by PCP took him off med    Patient Measurements: Height: 5\' 6"  (167.6 cm) Weight: 138 lb 10.7 oz (62.9 kg) IBW/kg (Calculated) : 63.8 Heparin Dosing Weight:   Vital Signs: Temp: 98.5 F (36.9 C) (04/01 0119) Temp Source: Oral (04/01 0119) BP: 105/63 mmHg (04/01 0119) Pulse Rate: 120 (04/01 0119)  Labs:  Recent Labs  07/26/14 2054  HGB 8.4*  HCT 26.7*  PLT 422*  CREATININE 0.93  TROPONINI <0.03    Estimated Creatinine Clearance: 70.5 mL/min (by C-G formula based on Cr of 0.93).   Medical History: Past Medical History  Diagnosis Date  . Atrial fibrillation   . Pneumonia   . CHF (congestive heart failure)   . Thalassemia minor   . Murmur   . Hemochromatosis   . COPD (chronic obstructive pulmonary disease)     Medications:  Prescriptions prior to admission  Medication Sig Dispense Refill Last Dose  . albuterol (PROVENTIL HFA;VENTOLIN HFA) 108 (90 BASE) MCG/ACT inhaler Inhale into the lungs every 6 (six) hours as needed for wheezing or shortness of breath.     Marland Kitchen. albuterol-ipratropium (COMBIVENT) 18-103 MCG/ACT inhaler Inhale into the lungs every 4 (four) hours.     . citalopram (CELEXA) 20 MG tablet Take 20 mg by mouth daily.     . folic acid (FOLVITE) 1 MG tablet Take 1 mg by mouth daily.     . furosemide (LASIX) 20 MG tablet Take 20 mg by mouth as needed.      Marland Kitchen. HYDROcodone-acetaminophen (NORCO) 10-325 MG per tablet Take 1 tablet by mouth every 6 (six) hours as needed.     . loratadine (CLARITIN) 10 MG tablet Take 10 mg by mouth daily.     . rivaroxaban (XARELTO) 10 MG TABS tablet Take 20 mg by mouth daily.     Marland Kitchen. zolmitriptan (ZOMIG) 5 MG tablet Take 5 mg by mouth as needed for migraine.     . deferasirox (EXJADE) 250 MG disintegrating tablet Take by mouth daily before  breakfast.     . HYDROcodone-homatropine (HYCODAN) 5-1.5 MG/5ML syrup Take 5 mLs by mouth every 6 (six) hours as needed for cough. 120 mL 0   . lisinopril (PRINIVIL,ZESTRIL) 10 MG tablet Take 10 mg by mouth daily.     . predniSONE (DELTASONE) 20 MG tablet 2 tabs po daily x 4 days 8 tablet 0    Scheduled:  . citalopram  20 mg Oral Daily  . folic acid  1 mg Oral Daily  . furosemide  40 mg Oral Daily  . ipratropium  0.5 mg Nebulization Q6H  . levalbuterol  0.63 mg Nebulization Q6H  . levofloxacin (LEVAQUIN) IV  750 mg Intravenous Q24H  . loratadine  10 mg Oral Daily  . predniSONE  50 mg Oral Q breakfast   Infusions:    Assessment: 64yo male with history of Afib, CHF and COPD presents with SOB. Pharmacy is consulted to dose xarelto for atrial fibrillation. Pt takes this at home. Hgb 8.4, Plt 422, sCr 0.9, BNP 273, Trop neg x1.  Goal of Therapy:  Monitor platelets by anticoagulation protocol: Yes   Plan:  Xarelto 20mg  PO daily with supper Daily CBC Monitor s/sx of bleeding  Arlean Hoppingorey M. Newman PiesBall, PharmD Clinical Pharmacist Pager [phone] 07/27/2014,2:31 AM

## 2014-07-27 NOTE — Progress Notes (Signed)
Inpatient Diabetes Program Recommendations  AACE/ADA: New Consensus Statement on Inpatient Glycemic Control (2013)  Target Ranges:  Prepandial:   less than 140 mg/dL      Peak postprandial:   less than 180 mg/dL (1-2 hours)      Critically ill patients:  140 - 180 mg/dL   Results for Kathy BreachHATSANAPHON, Wenceslao (MRN [id number]) as of 07/27/2014 08:10  Ref. Range 07/26/2014 20:54 07/26/2014 20:56 07/27/2014 03:44   Glucose Latest Range: 70-99 mg/dL 409258 (H)  811459 (H)    Reason for assessment: 2 elevated lab glucose  Diabetes history: none- A1C pending  Outpatient Diabetes medications: none Current orders for Inpatient glycemic control: none- noted to be on steroids    Consider checking fingerstick blood sugars tid and hs and order Novolog resistant correction scale tid and hs.   Susette RacerJulie Deniz Hannan, RN, BA, MHA, CDE Diabetes Coordinator Inpatient Diabetes Program  [phone] (Team Pager) [phone] Patrcia Dolly([place] Office) 07/27/2014 8:13 AM

## 2014-07-27 NOTE — H&P (Signed)
[facility] History and Physical  Patient: Barry Reed  MRN: [id number]  DOB: 1950/07/30  DOS: the patient was seen and examined on 07/27/2014 PCP: Pcp Not In System  Chief Complaint: Shortness of breath  HPI: Barry Reed is a 64 y.o. male with Past medical history of chronic atrial fibrillation, chronic CHF, hemochromatosis, mood disorder, recent admission for CHF and COPD exacerbation, recent hematoma of the left leg. The patient is presenting with compressive progressively worsening shortness of breath since last 2 days PCP. This was also associated with cough white expectoration. He also had some fever and chills. Patient is traveling from [place] and has been here since last Thursday. Patient denies any chest pain at the time of my evaluation but mentions that he has occasional pain like pins and needles in his chest. Denies any nausea or vomiting. Denies any diarrhea. Denies any burning urination. Occasionally has some leg cramps. He recently had developed a large bruise on the left thigh and was recommended by his cardiologist to hold his Xarelto and restart back on Monday.  The patient is coming from home. And at his baseline independent for most of his ADL.  Review of Systems: as mentioned in the history of present illness.  A Comprehensive review of the other systems is negative.  Past Medical History  Diagnosis Date  . Atrial fibrillation   . Pneumonia   . CHF (congestive heart failure)   . Thalassemia minor   . Murmur   . Hemochromatosis   . COPD (chronic obstructive pulmonary disease)    Past Surgical History  Procedure Laterality Date  . Coronary artery bypass graft    . Cholecystectomy    . Spleenectomy     Social History:  reports that he has quit smoking. He has never used smokeless tobacco. He reports that he does not drink alcohol or use illicit drugs.  Allergies  Allergen Reactions  . Metoprolol Other (See Comments)    Unknown by PCP  took him off med    No family history on file.  Prior to Admission medications   Medication Sig Start Date End Date Taking? Authorizing Provider  albuterol (PROVENTIL HFA;VENTOLIN HFA) 108 (90 BASE) MCG/ACT inhaler Inhale into the lungs every 6 (six) hours as needed for wheezing or shortness of breath.   Yes Historical Provider, MD  albuterol-ipratropium (COMBIVENT) 18-103 MCG/ACT inhaler Inhale into the lungs every 4 (four) hours.   Yes Historical Provider, MD  citalopram (CELEXA) 20 MG tablet Take 20 mg by mouth daily.   Yes Historical Provider, MD  folic acid (FOLVITE) 1 MG tablet Take 1 mg by mouth daily.   Yes Historical Provider, MD  furosemide (LASIX) 20 MG tablet Take 20 mg by mouth as needed.    Yes Historical Provider, MD  HYDROcodone-acetaminophen (NORCO) 10-325 MG per tablet Take 1 tablet by mouth every 6 (six) hours as needed.   Yes Historical Provider, MD  loratadine (CLARITIN) 10 MG tablet Take 10 mg by mouth daily.   Yes Historical Provider, MD  rivaroxaban (XARELTO) 10 MG TABS tablet Take 20 mg by mouth daily.   Yes Historical Provider, MD  zolmitriptan (ZOMIG) 5 MG tablet Take 5 mg by mouth as needed for migraine.   Yes Historical Provider, MD  deferasirox (EXJADE) 250 MG disintegrating tablet Take by mouth daily before breakfast.    Historical Provider, MD  HYDROcodone-homatropine (HYCODAN) 5-1.5 MG/5ML syrup Take 5 mLs by mouth every 6 (six) hours as needed for cough. 07/27/13   Melanie  Belfi, MD  lisinopril (PRINIVIL,ZESTRIL) 10 MG tablet Take 10 mg by mouth daily.    Historical Provider, MD  predniSONE (DELTASONE) 20 MG tablet 2 tabs po daily x 4 days 07/27/13   Rolan Bucco, MD    Physical Exam: Filed Vitals:   07/26/14 2156 07/26/14 2222 07/26/14 2345 07/27/14 0119  BP: 139/69   105/63  Pulse: 95   120  Temp:    98.5 F (36.9 C)  TempSrc:    Oral  Resp: 20   18  Height:     (1.676 m)  Weight:    62.9 kg (138 lb 10.7 oz)  SpO2: 95% 95% 94% 100%    General:  Alert, Awake and Oriented to Time, Place and Person. Appear in mild distress Eyes: PERRL ENT: Oral Mucosa clear moist. Neck: no JVD Cardiovascular: S1 and S2 Present, no Murmur, Peripheral Pulses Present Respiratory: Bilateral Air entry equal and Decreased, no Crackles, bilateral expiratory wheezes Abdomen: Bowel Sound present, Soft and non tender Skin: no Rash Extremities: no Pedal edema, no calf tenderness Neurologic: Grossly no focal neuro deficit.  Labs on Admission:  CBC:  Recent Labs Lab 07/26/14 2054 07/27/14 0344  WBC 4.6 13.6*  NEUTROABS 2.5 12.8*  HGB 8.4* 7.9*  HCT 26.7* 26.1*  MCV 77.8* 79.1  PLT 422* 394    CMP     Component Value Date/Time   NA 131* 07/26/2014 2054   K 4.3 07/26/2014 2054   CL 99 07/26/2014 2054   CO2 24 07/26/2014 2054   GLUCOSE 258* 07/26/2014 2054   BUN 12 07/26/2014 2054   CREATININE 0.93 07/26/2014 2054   CALCIUM 8.1* 07/26/2014 2054   PROT 7.0 07/26/2014 2054   ALBUMIN 3.8 07/26/2014 2054   AST 31 07/26/2014 2054   ALT 15 07/26/2014 2054   ALKPHOS 71 07/26/2014 2054   BILITOT 3.7* 07/26/2014 2054   GFRNONAA 86* 07/26/2014 2054   GFRAA >90 07/26/2014 2054    No results for input(s): LIPASE, AMYLASE in the last 168 hours.   Recent Labs Lab 07/26/14 2054  TROPONINI <0.03   BNP (last 3 results)  Recent Labs  07/26/14 2054  BNP 272.6*    ProBNP (last 3 results) No results for input(s): PROBNP in the last 8760 hours.   Radiological Exams on Admission: Dg Chest 2 View  07/26/2014   CLINICAL DATA:  Shortness of breath and fatigue for 2 days. COPD. Hemochromatosis. Thalassemia minor. Congestive heart failure. Atrial fibrillation.  EXAM: CHEST  2 VIEW  COMPARISON:  07/27/2013  FINDINGS: The heart size and mediastinal contours are within normal limits. Prior transverse sternotomy again noted, with other surgical clips in the anterior chest wall. Pleural-parenchymal scarring in the left lung base with tenting of left  hemidiaphragm is stable in appearance. Pulmonary hyperinflation is again seen, consistent with COPD.  There is no evidence of acute infiltrate or pulmonary edema. No evidence of pleural effusion. No mass or lymphadenopathy identified.  IMPRESSION: Stable COPD and left basilar scarring.  No active disease.   Electronically Signed   By: Myles Rosenthal M.D.   On: 07/26/2014 20:28   EKG: Independently reviewed. atrial fibrillation, RVR.  Assessment/Plan Principal Problem:   COPD exacerbation Active Problems:   Chronic atrial fibrillation   Chronic anticoagulation   Anemia   Hyperglycemia   Pleuritic chest pain   1. COPD exacerbation The patient is presenting with compressive cough and shortness of breath with fever and chills. Chest x-ray shows no pneumonia. Patient appears to  be having scabies relation. We will be admitted in the hospital. Continue with DuoNeb nebs, prednisone, levofloxacin. Oxygen as needed.  2. Anemia. Chronic anticoagulation. The patient has chronic anticoagulation and had recently developed a bruise. The bruise appears to be has improved and resolved. He continues to have progressively worsening anemia. Continue close monitoring. Denies any active bleeding.  3. Hyperglycemia. Checking hemoglobin A1c. Continues to remain elevated may require sliding scale insulin.  4. Chronic atrial fibrillation. At present rate is controlled. Continue monitoring on telemetry. Patient has not been on any rhythm control her rate control medication. Continue Xarelto at present. If there is further drop in H&H may require holding on Xarelto.  Advance goals of care discussion: Full code   DVT Prophylaxis: chronic therapeutic anticoagulation. Nutrition: Regular diet  Family Communication: Family was present at bedside, opportunity was given to ask question and all questions were answered satisfactorily at the time of interview. Disposition: Admitted to inpatient in telemetry  unit.  Author: Lynden OxfordPranav Chelbi Herber, MD [facility] Pager: [phone] 07/27/2014, 5:38 AM    If 7PM-7AM, please contact night-coverage [email or web address] Password [email or web address]

## 2014-07-27 NOTE — Progress Notes (Signed)
Utilization review completed.  

## 2014-07-27 NOTE — Progress Notes (Signed)
Patient seen and examined, currently still has some intermittent nonproductive cough, but does not seem in distress, lung diminished, heart IRRR, no edema.  Review labs, he does has hyperglycemia/mild hyponatremia(likely pseudohyponatremia due to elevated blood sugar). No known history of diabetes, steroids induced? Will check a1c, start lantus/ssi.

## 2014-07-27 NOTE — Progress Notes (Signed)
Pt called RN to his room. Pt was sitting on edge of bed and complaining of pain. Pt states that he is having 9/10 pain in his legs at this time. Pt was given Norco 10mg  at 0314. Explained to pt that it is not time for more pain medication but will page MD for suggestions. Pt also complaining that he cannot sleep and feeling jittery. Educated pt on the use of nebulizer treatments and how many treatments he has received over the night. Pt understands at this time. Will wait for MD to respond to page and will continue to monitor pt.

## 2014-07-28 DIAGNOSIS — I509 Heart failure, unspecified: Secondary | ICD-10-CM

## 2014-07-28 DIAGNOSIS — D563 Thalassemia minor: Secondary | ICD-10-CM

## 2014-07-28 LAB — GLUCOSE, CAPILLARY
GLUCOSE-CAPILLARY: 175 mg/dL — AB (ref 70–99)
GLUCOSE-CAPILLARY: 372 mg/dL — AB (ref 70–99)
Glucose-Capillary: 160 mg/dL — ABNORMAL HIGH (ref 70–99)
Glucose-Capillary: 80 mg/dL (ref 70–99)

## 2014-07-28 LAB — COMPREHENSIVE METABOLIC PANEL
ALT: 14 U/L (ref 0–53)
AST: 22 U/L (ref 0–37)
Albumin: 3.1 g/dL — ABNORMAL LOW (ref 3.5–5.2)
Alkaline Phosphatase: 66 U/L (ref 39–117)
Anion gap: 4 — ABNORMAL LOW (ref 5–15)
BUN: 22 mg/dL (ref 6–23)
CO2: 29 mmol/L (ref 19–32)
Calcium: 8.5 mg/dL (ref 8.4–10.5)
Chloride: 100 mmol/L (ref 96–112)
Creatinine, Ser: 1.55 mg/dL — ABNORMAL HIGH (ref 0.50–1.35)
GFR calc non Af Amer: 45 mL/min — ABNORMAL LOW (ref >90)
GFR, EST AFRICAN AMERICAN: 53 mL/min — AB (ref >90)
Glucose, Bld: 161 mg/dL — ABNORMAL HIGH (ref 70–99)
Potassium: 3.8 mmol/L (ref 3.5–5.1)
Sodium: 133 mmol/L — ABNORMAL LOW (ref 135–145)
TOTAL PROTEIN: 6.5 g/dL (ref 6.0–8.3)
Total Bilirubin: 3.6 mg/dL — ABNORMAL HIGH (ref 0.3–1.2)

## 2014-07-28 LAB — CBC
HEMATOCRIT: 25 % — AB (ref 39.0–52.0)
Hemoglobin: 7.4 g/dL — ABNORMAL LOW (ref 13.0–17.0)
MCH: 23.5 pg — AB (ref 26.0–34.0)
MCHC: 29.6 g/dL — ABNORMAL LOW (ref 30.0–36.0)
MCV: 79.4 fL (ref 78.0–100.0)
PLATELETS: 411 10*3/uL — AB (ref 150–400)
RBC: 3.15 MIL/uL — AB (ref 4.22–5.81)
RDW: 27.3 % — ABNORMAL HIGH (ref 11.5–15.5)
WBC: 13.2 10*3/uL — ABNORMAL HIGH (ref 4.0–10.5)

## 2014-07-28 LAB — BRAIN NATRIURETIC PEPTIDE: B Natriuretic Peptide: 692.3 pg/mL — ABNORMAL HIGH (ref 0.0–100.0)

## 2014-07-28 MED ORDER — RIVAROXABAN 15 MG PO TABS
15.0000 mg | ORAL_TABLET | Freq: Every day | ORAL | Status: DC
  Administered 2014-07-28: 15 mg via ORAL
  Filled 2014-07-28 (×2): qty 1

## 2014-07-28 MED ORDER — LEVOFLOXACIN IN D5W 750 MG/150ML IV SOLN: 750.0000 mg | INTRAVENOUS | Status: DC

## 2014-07-28 MED ORDER — GUAIFENESIN ER 600 MG PO TB12
600.0000 mg | ORAL_TABLET | Freq: Two times a day (BID) | ORAL | Status: DC
  Administered 2014-07-29: 600 mg via ORAL
  Filled 2014-07-28 (×3): qty 1

## 2014-07-28 NOTE — Progress Notes (Signed)
SATURATION QUALIFICATIONS: (This note is used to comply with regulatory documentation for home oxygen)  Patient Saturations on Room Air at Rest = 100%  Patient Saturations on Room Air while Ambulating = 97-100%  Patient Saturations on 0 Liters of oxygen while Ambulating = 0%  Please briefly explain why patient needs home oxygen: Patient ambulated without assistive device 250 feet without shortness of breath without oxygen.

## 2014-07-28 NOTE — Progress Notes (Signed)
PROGRESS NOTE  Barry BreachChansy Reed AVW:[id number] DOB: February 08, 1951 DOA: 07/26/2014 PCP: Pcp Not In System  HPI/Recap of past 24 hours:  Reported feeling better, requesting cough meds in am and at bedtime.  Assessment/Plan: Principal Problem:   COPD exacerbation Active Problems:   Chronic atrial fibrillation   Chronic anticoagulation   Anemia   Hyperglycemia   Pleuritic chest pain  COPD exacerbation:  The patient is presenting with cough and shortness of breath with fever and chills. Chest x-ray shows no pneumonia. Continue with DuoNeb nebs, prednisone, levofloxacin. On droplet precaution, awaiting respiratory viral panel.  Hyperglycemia: steroid induced? No known history of diabetes. a1c pending, continue on insulin.\  Chronic afib: initially on admission with afib/rvr, rate better controlled without meds. Continue home meds xarelto.  H/o thalassemia minor/chronic anemia/requiring intermittent prbc transfusion: reported getting prbc when hgb less than 7. Will monitor, transfuse prn.  H/o hemochromatosis: iron overload, on xjade.  Code Status: full  Family Communication: patient and wife  Disposition Plan: home in [place], Futures tradercare manager consulted. pmd Rovinder singh Saini [phone] [email or web address]  Hematology  Dr. Ilean Skillarshad shaikh  [phone]   Consultants:  none  Procedures:  none  Antibiotics:  levaquin   Objective: BP 108/64 mmHg  Pulse 96  Temp(Src) 98 F (36.7 C) (Oral)  Resp 17  Ht 5\' 6"  (1.676 m)  Wt 61 kg (134 lb 7.7 oz)  BMI 21.72 kg/m2  SpO2 98%  Intake/Output Summary (Last 24 hours) at 07/28/14 1702 Last data filed at 07/28/14 1437  Gross per 24 hour  Intake    480 ml  Output      0 ml  Net    480 ml   Filed Weights   07/27/14 0119 07/27/14 0559 07/28/14 0500  Weight: 62.9 kg (138 lb 10.7 oz) 62.9 kg (138 lb 10.7 oz) 61 kg (134 lb 7.7 oz)    Exam:   General:  NAD  Cardiovascular: IRRR  Respiratory: much improved  airentry, still +wheezing bilateral anteriorly/posterioly.  Abdomen: Soft/ND/NT, positive BS  Musculoskeletal: No Edema  Neuro: aaox3, nad  Data Reviewed: Basic Metabolic Panel:  Recent Labs Lab 07/26/14 2054 07/27/14 0344 07/28/14 0427  NA 131* 131* 133*  K 4.3 4.6 3.8  CL 99 98 100  CO2 24 22 29   GLUCOSE 258* 459* 161*  BUN 12 14 22   CREATININE 0.93 1.52* 1.55*  CALCIUM 8.1* 8.2* 8.5  MG  --  1.8  --    Liver Function Tests:  Recent Labs Lab 07/26/14 2054 07/27/14 0344 07/28/14 0427  AST 31 31 22   ALT 15 14 14   ALKPHOS 71 71 66  BILITOT 3.7* 4.1* 3.6*  PROT 7.0 6.5 6.5  ALBUMIN 3.8 3.3* 3.1*   No results for input(s): LIPASE, AMYLASE in the last 168 hours. No results for input(s): AMMONIA in the last 168 hours. CBC:  Recent Labs Lab 07/26/14 2054 07/27/14 0344 07/28/14 0427  WBC 4.6 13.6* 13.2*  NEUTROABS 2.5 12.8*  --   HGB 8.4* 7.9* 7.4*  HCT 26.7* 26.1* 25.0*  MCV 77.8* 79.1 79.4  PLT 422* 394 411*   Cardiac Enzymes:    Recent Labs Lab 07/26/14 2054 07/27/14 0344 07/27/14 0920 07/27/14 1510  TROPONINI <0.03 0.04* 0.06* 0.05*   BNP (last 3 results)  Recent Labs  07/26/14 2054 07/28/14 0427  BNP 272.6* 692.3*    ProBNP (last 3 results) No results for input(s): PROBNP in the last 8760 hours.  CBG:  Recent Labs Lab  07/27/14 2137 07/28/14 0627 07/28/14 1144 07/28/14 1614  GLUCAP 397* 160* 175* 372*    No results found for this or any previous visit (from the past 240 hour(s)).   Studies: No results found.  Scheduled Meds: . citalopram  20 mg Oral Daily  . folic acid  1 mg Oral Daily  . furosemide  40 mg Oral Daily  . insulin aspart  0-15 Units Subcutaneous TID WC  . insulin glargine  10 Units Subcutaneous QHS  . ipratropium  0.5 mg Nebulization Q6H  . levalbuterol  0.63 mg Nebulization Q6H  . [START ON 07/30/2014] levofloxacin (LEVAQUIN) IV  750 mg Intravenous Q48H  . loratadine  10 mg Oral Daily  . predniSONE  50  mg Oral Q breakfast  . rivaroxaban  15 mg Oral Q supper    Continuous Infusions:    Time spent: >45mins  Barry Vogan MD, PhD  [facility] Pager [phone]. If 7PM-7AM, please contact night-coverage at [email or web address], password [facility] 07/28/2014, 5:02 PM  LOS: 2 days

## 2014-07-28 NOTE — Progress Notes (Addendum)
ANTICOAGULATION CONSULT NOTE   Pharmacy Consult for Xarelto Indication: atrial fibrillation  Allergies  Allergen Reactions  . Metoprolol Other (See Comments)    Unknown by PCP took him off med    Patient Measurements: Height: 5\' 6"  (167.6 cm) Weight: 134 lb 7.7 oz (61 kg) IBW/kg (Calculated) : 63.8   Vital Signs: Temp: 98.6 F (37 C) (04/02 0432) Temp Source: Oral (04/02 0432) BP: 106/56 mmHg (04/02 0432) Pulse Rate: 86 (04/02 0432)  Labs:  Recent Labs  07/26/14 2054 07/27/14 0344 07/27/14 0920 07/27/14 1510 07/28/14 0427  HGB 8.4* 7.9*  --   --  7.4*  HCT 26.7* 26.1*  --   --  25.0*  PLT 422* 394  --   --  411*  LABPROT  --  31.8*  --   --   --   INR  --  3.05*  --   --   --   CREATININE 0.93 1.52*  --   --  1.55*  TROPONINI <0.03 0.04* 0.06* 0.05*  --     Estimated Creatinine Clearance: 41 mL/min (by C-G formula based on Cr of 1.55).   Medical History: Past Medical History  Diagnosis Date  . Atrial fibrillation   . Pneumonia   . CHF (congestive heart failure)   . Thalassemia minor   . Murmur   . Hemochromatosis   . COPD (chronic obstructive pulmonary disease)    Assessment: 64yo male with history of Afib, CHF and COPD presents with SOB. Pharmacy is consulted to dose xarelto for atrial fibrillation. Pt takes this at home.   Hgb down this am 7.4 and scr still elevated at 1.55. Calculated crcl based on TBW is ~6640ml/min. Will reduce to 15mg  daily.  Question if patient has hepatic insufficiency/congestion LFTs are normal although a calculated child pugh score of 11 makes him class C. Xarelto should be avoided in patients with liver dysfunction. Will continue to follow.  Goal of Therapy:  Monitor platelets by anticoagulation protocol: Yes   Plan:  Xarelto 15mg  PO daily with supper Daily CBC Monitor s/sx of bleeding  Sheppard CoilFrank Wilson PharmD., BCPS Clinical Pharmacist Pager [phone] 07/28/2014 11:39 AM

## 2014-07-28 NOTE — Progress Notes (Signed)
  Echocardiogram 2D Echocardiogram has been performed.  Barry Reed, Barry Reed 07/28/2014, 4:56 PM

## 2014-07-29 LAB — BASIC METABOLIC PANEL
Anion gap: 14 (ref 5–15)
BUN: 22 mg/dL (ref 6–23)
CO2: 22 mmol/L (ref 19–32)
CREATININE: 1.4 mg/dL — AB (ref 0.50–1.35)
Calcium: 8.5 mg/dL (ref 8.4–10.5)
Chloride: 101 mmol/L (ref 96–112)
GFR calc Af Amer: 59 mL/min — ABNORMAL LOW (ref >90)
GFR, EST NON AFRICAN AMERICAN: 51 mL/min — AB (ref >90)
Glucose, Bld: 135 mg/dL — ABNORMAL HIGH (ref 70–99)
Potassium: 4.3 mmol/L (ref 3.5–5.1)
SODIUM: 137 mmol/L (ref 135–145)

## 2014-07-29 LAB — GLUCOSE, CAPILLARY
Glucose-Capillary: 176 mg/dL — ABNORMAL HIGH (ref 70–99)
Glucose-Capillary: 306 mg/dL — ABNORMAL HIGH (ref 70–99)

## 2014-07-29 LAB — CBC
HEMATOCRIT: 28.1 % — AB (ref 39.0–52.0)
Hemoglobin: 8.3 g/dL — ABNORMAL LOW (ref 13.0–17.0)
MCH: 23.7 pg — AB (ref 26.0–34.0)
MCHC: 29.5 g/dL — AB (ref 30.0–36.0)
MCV: 80.3 fL (ref 78.0–100.0)
PLATELETS: 457 10*3/uL — AB (ref 150–400)
RBC: 3.5 MIL/uL — ABNORMAL LOW (ref 4.22–5.81)
RDW: 27.4 % — ABNORMAL HIGH (ref 11.5–15.5)
WBC: 14.8 10*3/uL — ABNORMAL HIGH (ref 4.0–10.5)

## 2014-07-29 LAB — BRAIN NATRIURETIC PEPTIDE: B NATRIURETIC PEPTIDE 5: 253.7 pg/mL — AB (ref 0.0–100.0)

## 2014-07-29 MED ORDER — PREDNISONE 10 MG PO TABS: 10.0000 mg | ORAL_TABLET | Freq: Every day | ORAL | Status: AC

## 2014-07-29 MED ORDER — CIPROFLOXACIN HCL 500 MG PO TABS: 500.0000 mg | ORAL_TABLET | Freq: Two times a day (BID) | ORAL | Status: AC

## 2014-07-29 MED ORDER — IPRATROPIUM BROMIDE 0.02 % IN SOLN: 0.5000 mg | Freq: Four times a day (QID) | RESPIRATORY_TRACT | Status: DC | PRN

## 2014-07-29 MED ORDER — GUAIFENESIN ER 600 MG PO TB12: 600.0000 mg | ORAL_TABLET | Freq: Two times a day (BID) | ORAL | Status: AC

## 2014-07-29 MED ORDER — LEVALBUTEROL HCL 0.63 MG/3ML IN NEBU: 0.6300 mg | INHALATION_SOLUTION | Freq: Four times a day (QID) | RESPIRATORY_TRACT | Status: DC | PRN

## 2014-07-29 MED ORDER — LISINOPRIL 10 MG PO TABS: 5.0000 mg | ORAL_TABLET | Freq: Every day | ORAL | Status: AC

## 2014-07-29 MED ORDER — INSULIN GLARGINE 100 UNIT/ML ~~LOC~~ SOLN
10.0000 [IU] | Freq: Every day | SUBCUTANEOUS | Status: DC
  Filled 2014-07-29: qty 0.1

## 2014-07-29 MED ORDER — INSULIN GLARGINE 100 UNIT/ML SOLOSTAR PEN: 5.0000 [IU] | PEN_INJECTOR | Freq: Every morning | SUBCUTANEOUS | Status: AC

## 2014-07-29 NOTE — Discharge Instructions (Signed)

## 2014-07-29 NOTE — Care Management Note (Signed)
    Page 1 of 1   07/29/2014     1:21:49 PM CARE MANAGEMENT NOTE 07/29/2014  Patient:  Kathy BreachHATSANAPHON,Cylis   Account Number:  [id number]  Date Initiated:  07/29/2014  Documentation initiated by:  [facility]Sanad Fear[facility]  Subjective/Objective Assessment:   adm: Shortness of breath     Action/Plan:   discharge planning   Anticipated DC Date:  07/29/2014   Anticipated DC Plan:  HOME/SELF CARE      DC Planning Services  CM consult      Choice offered to / List presented to:             Status of service:  Completed, signed off Medicare Important Message given?   (If response is "NO", the following Medicare IM given date fields will be blank) Date Medicare IM given:   Medicare IM given by:   Date Additional Medicare IM given:   Additional Medicare IM given by:    Discharge Disposition:  HOME/SELF CARE  Per UR Regulation:    If discussed at Long Length of Stay Meetings, dates discussed:    Comments:  07/29/14 13:00 CM received request from RN for medical records for pt who is travelling [place] from [place] and is going back.  Pt can either request medical record through his follow up physician's office by signing an electronic medical release and records will be faxecx to physician's office or request by calling Medical Records during the week and they will arrange for release and mail to pt.  No other CM needs were communicated. Freddy JakschSarah Thula Stewart, BSN, CM [phone].

## 2014-07-29 NOTE — Discharge Summary (Addendum)
Discharge Summary  Barry Reed ZOX:[id number] DOB: 06/07/1950  PCP: Pcp Not In System  Admit date: 07/26/2014 Discharge date: 07/29/2014  Time spent: >30mins  Recommendations for Outpatient Follow-up:  F/u with PMD within in two weeks. pmd to repeat cbc/bmp, monitor blood sugar.  pmd Dr. Cherrie Gauze singh Saini  Phone: [phone] website: [email or web address]  Discharge Diagnoses:  Active Hospital Problems   Diagnosis Date Noted  . COPD exacerbation 07/26/2014  . Chronic atrial fibrillation 07/27/2014  . Chronic anticoagulation 07/27/2014  . Anemia 07/27/2014  . Hyperglycemia 07/27/2014  . Pleuritic chest pain 07/27/2014    Resolved Hospital Problems   Diagnosis Date Noted Date Resolved  No resolved problems to display.    Discharge Condition: stable  Diet recommendation: heart healthy/carb modified  Filed Weights   07/27/14 0559 07/28/14 0500 07/29/14 0420  Weight: 62.9 kg (138 lb 10.7 oz) 61 kg (134 lb 7.7 oz) 59.5 kg (131 lb 2.8 oz)    History of present illness:  Barry Reed is a 64 y.o. male with Past medical history of chronic atrial fibrillation, chronic diastolic CHF, hemochromatosis, mood disorder, recent admission for CHF and COPD exacerbation, recent hematoma of the left leg. The patient is presenting with compressive progressively worsening shortness of breath since last 2 days.This was also associated with cough white expectoration. He also had some fever and chills. Patient is traveling from [place] and has been here since last Thursday. Patient denies any chest pain at the time of my evaluation but mentions that he has occasional pain like pins and needles in his chest. Denies any nausea or vomiting. Denies any diarrhea. Denies any burning urination. Occasionally has some leg cramps. He recently had developed a large bruise on the left thigh and was recommended by his cardiologist to hold his Xarelto and restart back on Monday.  The patient is  coming from home. And at his baseline independent for most of his ADL.  Hospital Course:  Principal Problem:   COPD exacerbation Active Problems:   Chronic atrial fibrillation   Chronic anticoagulation   Anemia   Hyperglycemia   Pleuritic chest pain COPD exacerbation:  The patient is presenting with cough/shortness of breath/fever/chills/wheezes. Chest x-ray shows no pneumonia. Treated with DuoNeb nebs, prednisone, levofloxacin. On droplet precaution, respiratory viral panel still pending, Patient symptom has improved, no cough, no sob, ambulated on room air without difficulty, lung exam at discharge with good airentry, minimal residual wheezes on 4/3, he expressed wishes to be discharged home. He is prescribed with cipro/prednison taper, continue nebulizer/prn inhaler.  Hyperglycemia: steroid induced? No known history of diabetes. a1c pending, received ssi in the hospital. pmd to repeat labs, monitor blood sugar. Patient blood sugar gets to 300 after am prednisone, generally get to normal in the evening, will provide lantus pen 5units qam for a few days, stop lantus once taper off prednisone. Further blood sugar testing and treatment per PMD.  Chronic afib: initially on admission with afib/rvr, rate better controlled without meds. Continue home meds xarelto.  H/o HTN, bp low normal entire hospitalization, home meds lisinopril does decreased to 5mg  po qd. (was on 10mg  po qd)  H/o thalassemia minor/chronic anemia/requiring intermittent prbc transfusion: no transfusion needed during this hospitalization. hgb 8.3 on 4/3.  H/o hemochromatosis: iron overload, on xjade.  Procedures:  none  Consultations:  none  Discharge Exam: BP 99/70 mmHg  Pulse 89  Temp(Src) 97.9 F (36.6 C) (Oral)  Resp 18  Ht 5\' 6"  (1.676 m)  Wt 59.5 kg (131  lb 2.8 oz)  BMI 21.18 kg/m2  SpO2 96%   General: NAD  Cardiovascular: IRRR  Respiratory: much improved airentry, only minimal residual  wheezes.  Abdomen: Soft/ND/NT, positive BS  Musculoskeletal: No Edema  Neuro: aaox3, nad   Discharge Instructions You were cared for by a hospitalist during your hospital stay. If you have any questions about your discharge medications or the care you received while you were in the hospital after you are discharged, you can call the unit and asked to speak with the hospitalist on call if the hospitalist that took care of you is not available. Once you are discharged, your primary care physician will handle any further medical issues. Please note that NO REFILLS for any discharge medications will be authorized once you are discharged, as it is imperative that you return to your primary care physician (or establish a relationship with a primary care physician if you do not have one) for your aftercare needs so that they can reassess your need for medications and monitor your lab values.      Discharge Instructions    Diet - low sodium heart healthy    Complete by:  As directed      Increase activity slowly    Complete by:  As directed             Medication List    TAKE these medications        albuterol 108 (90 BASE) MCG/ACT inhaler  Commonly known as:  PROVENTIL HFA;VENTOLIN HFA  Inhale into the lungs every 6 (six) hours as needed for wheezing or shortness of breath.     albuterol-ipratropium 18-103 MCG/ACT inhaler  Commonly known as:  COMBIVENT  Inhale into the lungs every 4 (four) hours.     ciprofloxacin 500 MG tablet  Commonly known as:  CIPRO  Take 1 tablet (500 mg total) by mouth 2 (two) times daily.     citalopram 20 MG tablet  Commonly known as:  CELEXA  Take 20 mg by mouth daily.     deferasirox 250 MG disintegrating tablet  Commonly known as:  EXJADE  Take by mouth daily before breakfast.     folic acid 1 MG tablet  Commonly known as:  FOLVITE  Take 1 mg by mouth daily.     furosemide 20 MG tablet  Commonly known as:  LASIX  Take 20 mg by mouth as  needed.     guaiFENesin 600 MG 12 hr tablet  Commonly known as:  MUCINEX  Take 1 tablet (600 mg total) by mouth 2 (two) times daily.     HYDROcodone-acetaminophen 10-325 MG per tablet  Commonly known as:  NORCO  Take 1 tablet by mouth every 6 (six) hours as needed.     HYDROcodone-homatropine 5-1.5 MG/5ML syrup  Commonly known as:  HYCODAN  Take 5 mLs by mouth every 6 (six) hours as needed for cough.     Insulin Glargine 100 UNIT/ML Solostar Pen  Commonly known as:  LANTUS SOLOSTAR  Inject 5 Units into the skin every morning. Stop lantus injection once you finish taking prednisone. Further blood sugar testing and treatment directed by primary care physician.  Start taking on:  07/30/2014     lisinopril 10 MG tablet  Commonly known as:  PRINIVIL,ZESTRIL  Take 0.5 tablets (5 mg total) by mouth daily.     loratadine 10 MG tablet  Commonly known as:  CLARITIN  Take 10 mg by mouth daily.     predniSONE  10 MG tablet  Commonly known as:  DELTASONE  Take 1 tablet (10 mg total) by mouth daily with breakfast. Take 50mg  on day1, 40mg  on day2, 30mg  on day3, 20mg  on day4, 10mg  on day 5, then stop.     rivaroxaban 10 MG Tabs tablet  Commonly known as:  XARELTO  Take 20 mg by mouth daily.     zolmitriptan 5 MG tablet  Commonly known as:  ZOMIG  Take 5 mg by mouth as needed for migraine.     zolpidem 5 MG tablet  Commonly known as:  AMBIEN  Take 5 mg by mouth at bedtime as needed for sleep.       Allergies  Allergen Reactions  . Metoprolol Other (See Comments)    Unknown by PCP took him off med   Follow-up Information    Follow up with [facility].   Why:  Call this number to get a Primary Care Physician   Contact information:   [phone]       The results of significant diagnostics from this hospitalization (including imaging, microbiology, ancillary and laboratory) are listed below for reference.    Significant Diagnostic Studies: Dg Chest 2 View  07/26/2014    CLINICAL DATA:  Shortness of breath and fatigue for 2 days. COPD. Hemochromatosis. Thalassemia minor. Congestive heart failure. Atrial fibrillation.  EXAM: CHEST  2 VIEW  COMPARISON:  07/27/2013  FINDINGS: The heart size and mediastinal contours are within normal limits. Prior transverse sternotomy again noted, with other surgical clips in the anterior chest wall. Pleural-parenchymal scarring in the left lung base with tenting of left hemidiaphragm is stable in appearance. Pulmonary hyperinflation is again seen, consistent with COPD.  There is no evidence of acute infiltrate or pulmonary edema. No evidence of pleural effusion. No mass or lymphadenopathy identified.  IMPRESSION: Stable COPD and left basilar scarring.  No active disease.   Electronically Signed   By: Myles RosenthalJohn  Stahl M.D.   On: 07/26/2014 20:28    Microbiology: No results found for this or any previous visit (from the past 240 hour(s)).   Labs: Basic Metabolic Panel:  Recent Labs Lab 07/26/14 2054 07/27/14 0344 07/28/14 0427 07/29/14 0548  NA 131* 131* 133* 137  K 4.3 4.6 3.8 4.3  CL 99 98 100 101  CO2 24 22 29 22   GLUCOSE 258* 459* 161* 135*  BUN 12 14 22 22   CREATININE 0.93 1.52* 1.55* 1.40*  CALCIUM 8.1* 8.2* 8.5 8.5  MG  --  1.8  --   --    Liver Function Tests:  Recent Labs Lab 07/26/14 2054 07/27/14 0344 07/28/14 0427  AST 31 31 22   ALT 15 14 14   ALKPHOS 71 71 66  BILITOT 3.7* 4.1* 3.6*  PROT 7.0 6.5 6.5  ALBUMIN 3.8 3.3* 3.1*   No results for input(s): LIPASE, AMYLASE in the last 168 hours. No results for input(s): AMMONIA in the last 168 hours. CBC:  Recent Labs Lab 07/26/14 2054 07/27/14 0344 07/28/14 0427 07/29/14 0548  WBC 4.6 13.6* 13.2* 14.8*  NEUTROABS 2.5 12.8*  --   --   HGB 8.4* 7.9* 7.4* 8.3*  HCT 26.7* 26.1* 25.0* 28.1*  MCV 77.8* 79.1 79.4 80.3  PLT 422* 394 411* 457*   Cardiac Enzymes:  Recent Labs Lab 07/26/14 2054 07/27/14 0344 07/27/14 0920 07/27/14 1510  TROPONINI  <0.03 0.04* 0.06* 0.05*   BNP: BNP (last 3 results)  Recent Labs  07/26/14 2054 07/28/14 0427 07/29/14 0548  BNP 272.6* 692.3*  253.7*    ProBNP (last 3 results) No results for input(s): PROBNP in the last 8760 hours.  CBG:  Recent Labs Lab 07/28/14 1144 07/28/14 1614 07/28/14 2112 07/29/14 0605 07/29/14 1139  GLUCAP 175* 372* 80 176* 306*       Signed:  Roena Sassaman MD, PhD  [facility] 07/29/2014, 12:05 PM

## 2014-07-30 LAB — HEMOGLOBIN A1C
Hgb A1c MFr Bld: 5.5 % (ref 4.8–5.6)
Hgb A1c MFr Bld: 5.6 % (ref 4.8–5.6)
MEAN PLASMA GLUCOSE: 114 mg/dL
Mean Plasma Glucose: 111 mg/dL

## 2014-07-31 LAB — RESPIRATORY VIRUS PANEL
Adenovirus: NEGATIVE
INFLUENZA A: NEGATIVE
Influenza B: NEGATIVE
METAPNEUMOVIRUS: NEGATIVE
PARAINFLUENZA 1 A: NEGATIVE
Parainfluenza 2: NEGATIVE
Parainfluenza 3: NEGATIVE
RESPIRATORY SYNCYTIAL VIRUS A: NEGATIVE
RESPIRATORY SYNCYTIAL VIRUS B: NEGATIVE
Rhinovirus: NEGATIVE

## 2015-03-05 IMAGING — CR DG CHEST 2V
2 series · 2 of 2 positions shown · non-contrast
Comparison: None.

CLINICAL DATA: Cough and congestion

EXAM:
CHEST - 2 VIEW

[w chest pa]
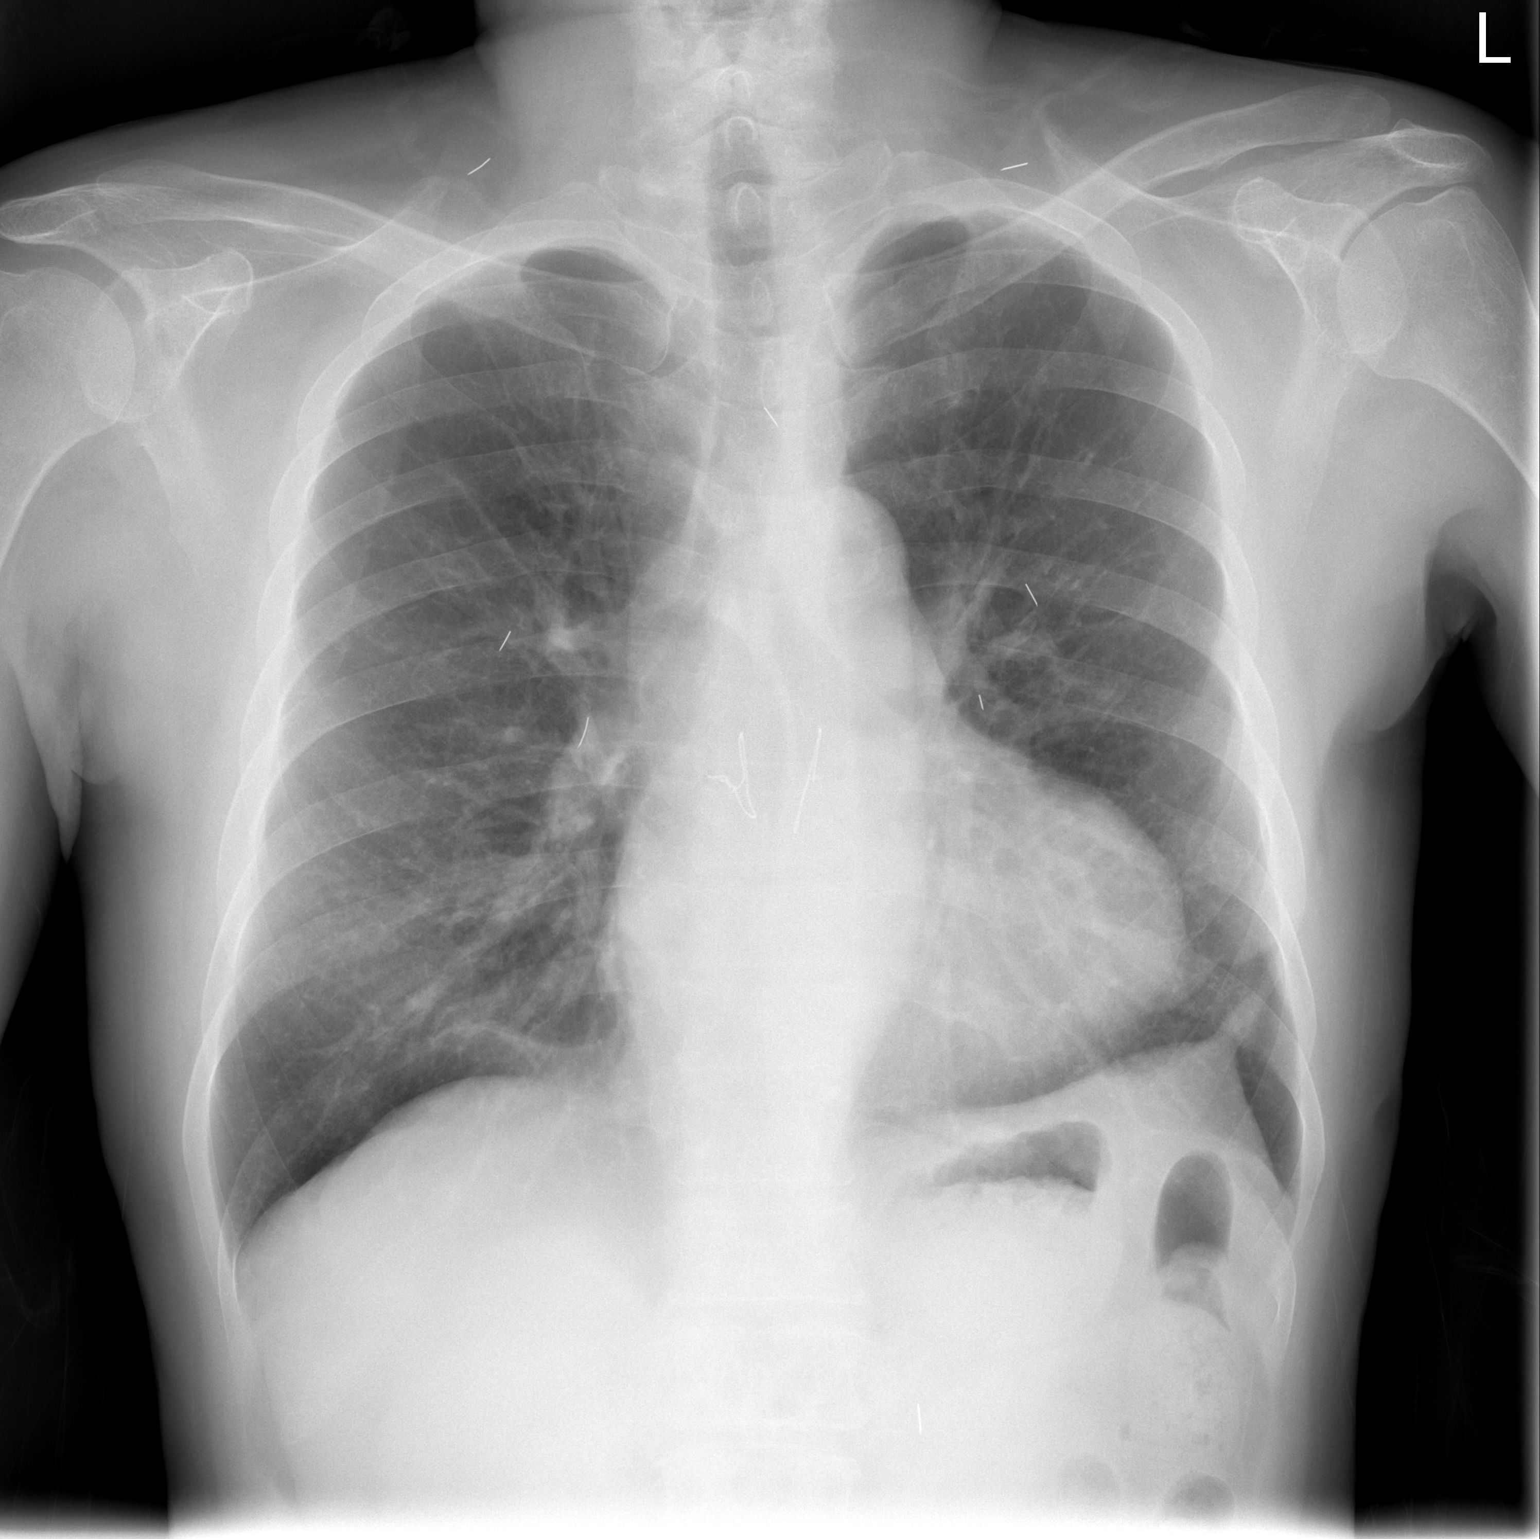

[w chest lat]
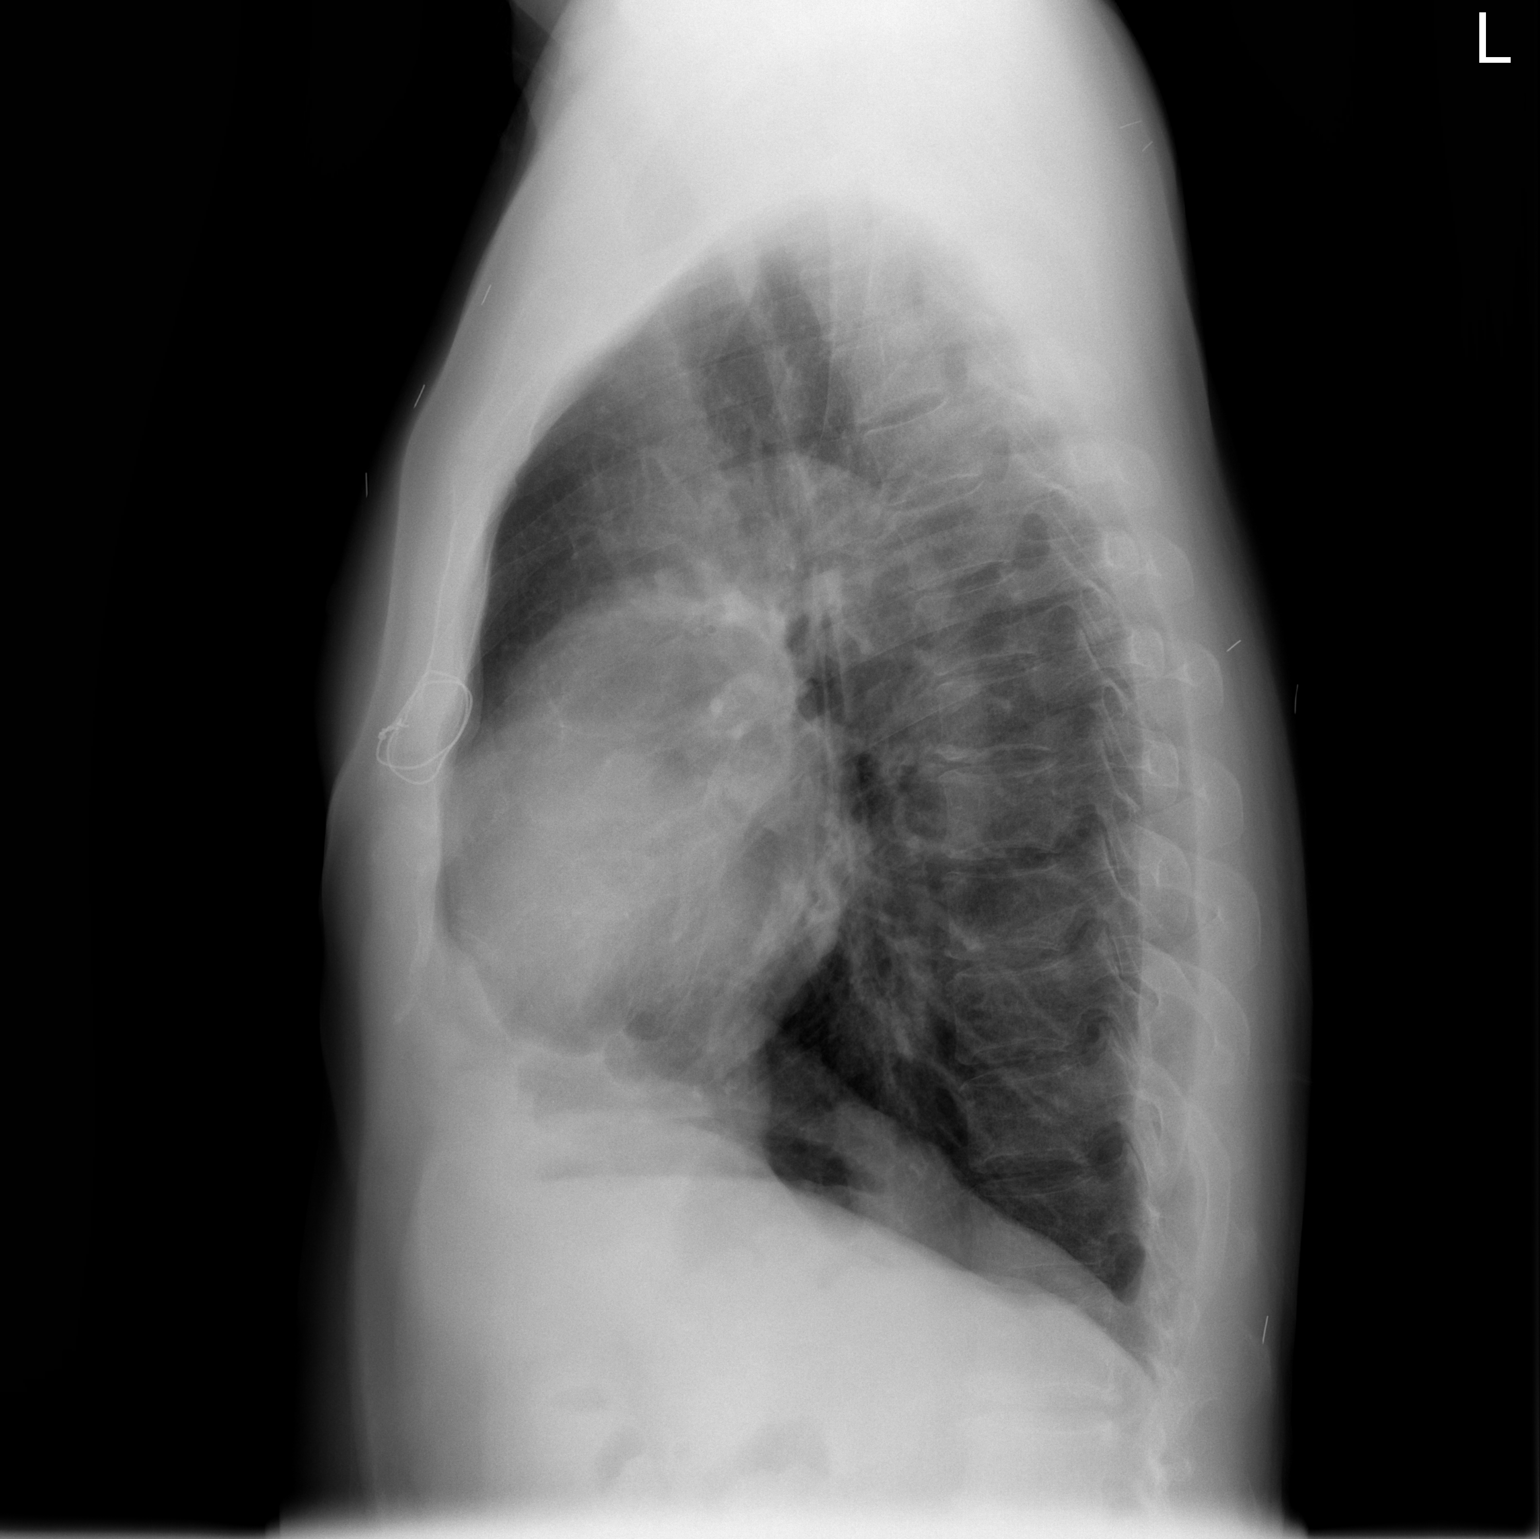

[2 of 2 positions shown; findings below may reference images not displayed]

FINDINGS: Borderline cardiomegaly with prominence of the left ventricle.
Postoperative changes from transverse sternotomy. There is tenting
of the left hemidiaphragm compatible with scarring. Lungs are
otherwise clear. No pneumothorax or pleural effusion. Mild
hyperaeration.
IMPRESSION: No active cardiopulmonary disease. Chronic and postoperative changes
are noted

## 2016-03-03 IMAGING — CR DG CHEST 2V
2 series · 2 of 2 positions shown · non-contrast
Comparison: 07/27/2013

CLINICAL DATA: Shortness of breath and fatigue for 2 days. COPD.
Hemochromatosis. Thalassemia minor. Congestive heart failure. Atrial
fibrillation.

EXAM:
CHEST  2 VIEW

[w chest pa]
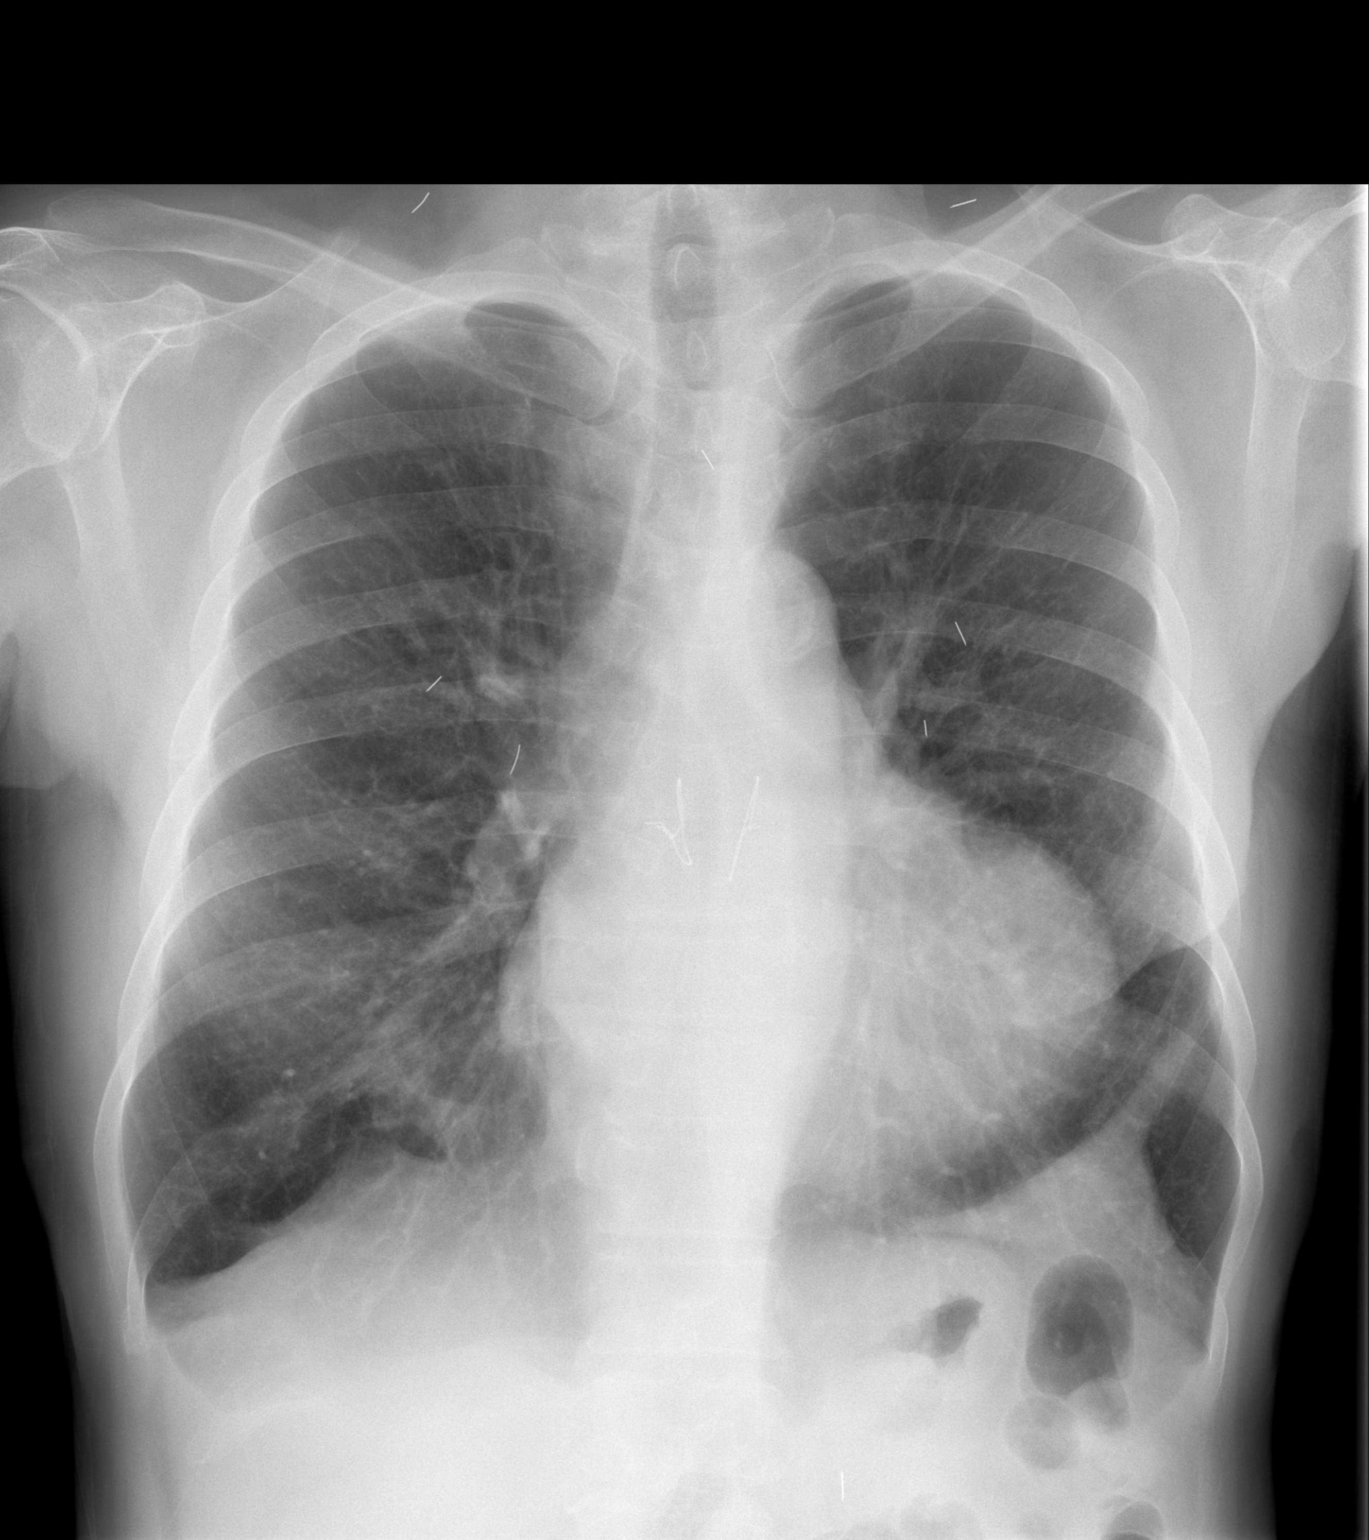

[w chest lat]
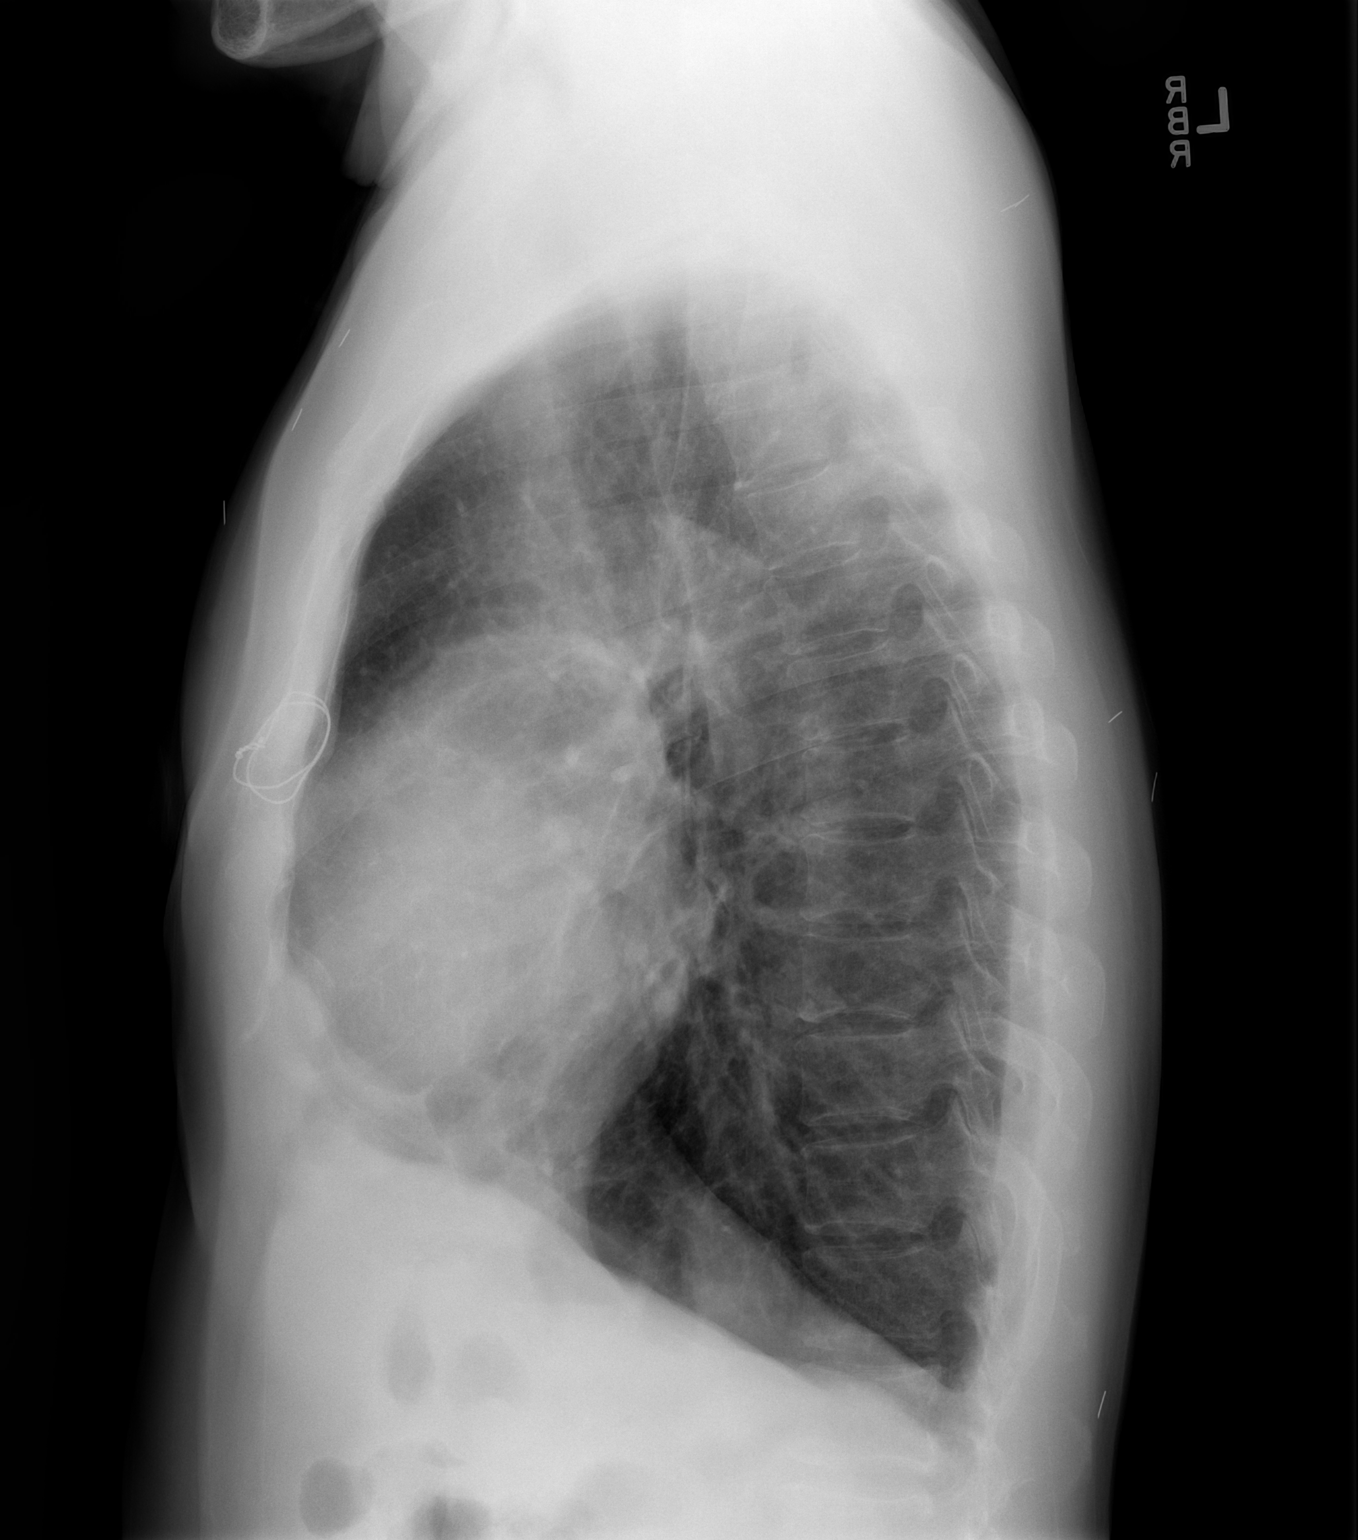

[2 of 2 positions shown; findings below may reference images not displayed]

FINDINGS: The heart size and mediastinal contours are within normal limits.
Prior transverse sternotomy again noted, with other surgical clips
in the anterior chest wall. Pleural-parenchymal scarring in the left
lung base with tenting of left hemidiaphragm is stable in
appearance. Pulmonary hyperinflation is again seen, consistent with
COPD.

There is no evidence of acute infiltrate or pulmonary edema. No
evidence of pleural effusion. No mass or lymphadenopathy identified.
IMPRESSION: Stable COPD and left basilar scarring.  No active disease.

## 2020-11-25 DEATH — deceased
